# Patient Record
Sex: Male | Born: 1965 | Race: White | Hispanic: No | Smoking: Current every day smoker
Health system: Southern US, Community
[De-identification: ages and names within clinical notes are randomized; demographics above are authoritative.]

## PROBLEM LIST (undated history)

## (undated) DIAGNOSIS — R569 Unspecified convulsions: Secondary | ICD-10-CM

## (undated) DIAGNOSIS — I639 Cerebral infarction, unspecified: Secondary | ICD-10-CM

## (undated) DIAGNOSIS — G839 Paralytic syndrome, unspecified: Secondary | ICD-10-CM

## (undated) HISTORY — PX: BACK SURGERY: SHX140

---

## 1997-12-13 ENCOUNTER — Emergency Department (HOSPITAL_COMMUNITY): Admission: EM | Admit: 1997-12-13 | Discharge: 1997-12-13 | Payer: Self-pay | Admitting: Emergency Medicine

## 1997-12-14 ENCOUNTER — Emergency Department (HOSPITAL_COMMUNITY): Admission: EM | Admit: 1997-12-14 | Discharge: 1997-12-14 | Payer: Self-pay | Admitting: Emergency Medicine

## 1997-12-15 ENCOUNTER — Emergency Department (HOSPITAL_COMMUNITY): Admission: EM | Admit: 1997-12-15 | Discharge: 1997-12-15 | Payer: Self-pay | Admitting: *Deleted

## 1998-01-27 ENCOUNTER — Ambulatory Visit (HOSPITAL_COMMUNITY): Admission: RE | Admit: 1998-01-27 | Discharge: 1998-01-27 | Payer: Self-pay | Admitting: Orthopedic Surgery

## 1998-02-16 ENCOUNTER — Observation Stay (HOSPITAL_COMMUNITY): Admission: RE | Admit: 1998-02-16 | Discharge: 1998-02-17 | Payer: Self-pay | Admitting: Orthopedic Surgery

## 2004-04-29 ENCOUNTER — Emergency Department (HOSPITAL_COMMUNITY): Admission: EM | Admit: 2004-04-29 | Discharge: 2004-04-29 | Payer: Self-pay | Admitting: Emergency Medicine

## 2004-07-10 ENCOUNTER — Ambulatory Visit (HOSPITAL_COMMUNITY): Admission: RE | Admit: 2004-07-10 | Discharge: 2004-07-10 | Payer: Self-pay | Admitting: Neurosurgery

## 2008-09-14 ENCOUNTER — Emergency Department (HOSPITAL_COMMUNITY): Admission: EM | Admit: 2008-09-14 | Discharge: 2008-09-14 | Payer: Self-pay | Admitting: Family Medicine

## 2008-10-04 ENCOUNTER — Emergency Department (HOSPITAL_COMMUNITY): Admission: EM | Admit: 2008-10-04 | Discharge: 2008-10-04 | Payer: Self-pay | Admitting: Family Medicine

## 2008-10-13 ENCOUNTER — Emergency Department (HOSPITAL_COMMUNITY): Admission: EM | Admit: 2008-10-13 | Discharge: 2008-10-13 | Payer: Self-pay | Admitting: Emergency Medicine

## 2008-10-13 ENCOUNTER — Emergency Department (HOSPITAL_COMMUNITY): Admission: EM | Admit: 2008-10-13 | Discharge: 2008-10-13 | Payer: Self-pay | Admitting: Family Medicine

## 2009-01-25 ENCOUNTER — Encounter
Admission: RE | Admit: 2009-01-25 | Discharge: 2009-04-25 | Payer: Self-pay | Admitting: Physical Medicine & Rehabilitation

## 2009-02-01 ENCOUNTER — Ambulatory Visit: Payer: Self-pay | Admitting: Physical Medicine & Rehabilitation

## 2009-03-15 ENCOUNTER — Ambulatory Visit: Payer: Self-pay | Admitting: Physical Medicine & Rehabilitation

## 2009-05-10 ENCOUNTER — Encounter: Admission: RE | Admit: 2009-05-10 | Discharge: 2009-05-10 | Payer: Self-pay | Admitting: Neurosurgery

## 2009-08-28 ENCOUNTER — Emergency Department (HOSPITAL_COMMUNITY): Admission: EM | Admit: 2009-08-28 | Discharge: 2009-08-28 | Payer: Self-pay | Admitting: Emergency Medicine

## 2009-10-06 ENCOUNTER — Inpatient Hospital Stay (HOSPITAL_COMMUNITY): Admission: EM | Admit: 2009-10-06 | Discharge: 2009-10-14 | Payer: Self-pay | Admitting: Emergency Medicine

## 2009-10-06 ENCOUNTER — Ambulatory Visit: Payer: Self-pay | Admitting: Critical Care Medicine

## 2009-10-06 ENCOUNTER — Ambulatory Visit: Payer: Self-pay | Admitting: Cardiology

## 2009-10-12 ENCOUNTER — Encounter (INDEPENDENT_AMBULATORY_CARE_PROVIDER_SITE_OTHER): Payer: Self-pay | Admitting: Internal Medicine

## 2009-10-13 ENCOUNTER — Ambulatory Visit: Payer: Self-pay | Admitting: Psychiatry

## 2009-10-26 DEATH — deceased

## 2010-10-21 LAB — GLUCOSE, CAPILLARY
Glucose-Capillary: 100 mg/dL — ABNORMAL HIGH (ref 70–99)
Glucose-Capillary: 102 mg/dL — ABNORMAL HIGH (ref 70–99)
Glucose-Capillary: 103 mg/dL — ABNORMAL HIGH (ref 70–99)
Glucose-Capillary: 108 mg/dL — ABNORMAL HIGH (ref 70–99)
Glucose-Capillary: 109 mg/dL — ABNORMAL HIGH (ref 70–99)
Glucose-Capillary: 109 mg/dL — ABNORMAL HIGH (ref 70–99)
Glucose-Capillary: 109 mg/dL — ABNORMAL HIGH (ref 70–99)
Glucose-Capillary: 111 mg/dL — ABNORMAL HIGH (ref 70–99)
Glucose-Capillary: 112 mg/dL — ABNORMAL HIGH (ref 70–99)
Glucose-Capillary: 121 mg/dL — ABNORMAL HIGH (ref 70–99)
Glucose-Capillary: 122 mg/dL — ABNORMAL HIGH (ref 70–99)
Glucose-Capillary: 63 mg/dL — ABNORMAL LOW (ref 70–99)
Glucose-Capillary: 68 mg/dL — ABNORMAL LOW (ref 70–99)
Glucose-Capillary: 78 mg/dL (ref 70–99)
Glucose-Capillary: 84 mg/dL (ref 70–99)
Glucose-Capillary: 84 mg/dL (ref 70–99)

## 2010-10-21 LAB — BASIC METABOLIC PANEL
BUN: 10 mg/dL (ref 6–23)
BUN: 14 mg/dL (ref 6–23)
BUN: 15 mg/dL (ref 6–23)
BUN: 7 mg/dL (ref 6–23)
BUN: 9 mg/dL (ref 6–23)
CO2: 24 mEq/L (ref 19–32)
CO2: 26 mEq/L (ref 19–32)
CO2: 27 mEq/L (ref 19–32)
CO2: 30 mEq/L (ref 19–32)
Calcium: 7.4 mg/dL — ABNORMAL LOW (ref 8.4–10.5)
Calcium: 7.5 mg/dL — ABNORMAL LOW (ref 8.4–10.5)
Calcium: 7.9 mg/dL — ABNORMAL LOW (ref 8.4–10.5)
Calcium: 8 mg/dL — ABNORMAL LOW (ref 8.4–10.5)
Calcium: 8 mg/dL — ABNORMAL LOW (ref 8.4–10.5)
Calcium: 8.3 mg/dL — ABNORMAL LOW (ref 8.4–10.5)
Calcium: 8.4 mg/dL (ref 8.4–10.5)
Chloride: 105 mEq/L (ref 96–112)
Chloride: 108 mEq/L (ref 96–112)
Chloride: 108 mEq/L (ref 96–112)
Creatinine, Ser: 0.85 mg/dL (ref 0.4–1.5)
Creatinine, Ser: 0.86 mg/dL (ref 0.4–1.5)
Creatinine, Ser: 0.87 mg/dL (ref 0.4–1.5)
Creatinine, Ser: 0.88 mg/dL (ref 0.4–1.5)
Creatinine, Ser: 1 mg/dL (ref 0.4–1.5)
Creatinine, Ser: 1.24 mg/dL (ref 0.4–1.5)
GFR calc Af Amer: >60 mL/min (ref >60)
GFR calc Af Amer: >60 mL/min (ref >60)
GFR calc Af Amer: >60 mL/min (ref >60)
GFR calc Af Amer: >60 mL/min (ref >60)
GFR calc Af Amer: >60 mL/min (ref >60)
GFR calc non Af Amer: >60 mL/min (ref >60)
GFR calc non Af Amer: >60 mL/min (ref >60)
GFR calc non Af Amer: >60 mL/min (ref >60)
GFR calc non Af Amer: >60 mL/min (ref >60)
GFR calc non Af Amer: >60 mL/min (ref >60)
GFR calc non Af Amer: >60 mL/min (ref >60)
GFR calc non Af Amer: >60 mL/min (ref >60)
GFR calc non Af Amer: >60 mL/min (ref >60)
Glucose, Bld: 109 mg/dL — ABNORMAL HIGH (ref 70–99)
Glucose, Bld: 128 mg/dL — ABNORMAL HIGH (ref 70–99)
Glucose, Bld: 165 mg/dL — ABNORMAL HIGH (ref 70–99)
Glucose, Bld: 77 mg/dL (ref 70–99)
Glucose, Bld: 92 mg/dL (ref 70–99)
Glucose, Bld: 92 mg/dL (ref 70–99)
Glucose, Bld: 95 mg/dL (ref 70–99)
Potassium: 4 mEq/L (ref 3.5–5.1)
Potassium: 4 mEq/L (ref 3.5–5.1)
Sodium: 137 mEq/L (ref 135–145)
Sodium: 138 mEq/L (ref 135–145)
Sodium: 141 mEq/L (ref 135–145)
Sodium: 143 mEq/L (ref 135–145)
Sodium: 145 mEq/L (ref 135–145)
Sodium: 146 mEq/L — ABNORMAL HIGH (ref 135–145)

## 2010-10-21 LAB — ABO/RH: ABO/RH(D): O POS

## 2010-10-21 LAB — MAGNESIUM
Magnesium: 1.7 mg/dL (ref 1.5–2.5)
Magnesium: 1.7 mg/dL (ref 1.5–2.5)
Magnesium: 1.8 mg/dL (ref 1.5–2.5)
Magnesium: 1.9 mg/dL (ref 1.5–2.5)
Magnesium: 2.1 mg/dL (ref 1.5–2.5)

## 2010-10-21 LAB — CARDIAC PANEL(CRET KIN+CKTOT+MB+TROPI)
CK, MB: 4.1 ng/mL — ABNORMAL HIGH (ref 0.3–4.0)
CK, MB: 5.7 ng/mL — ABNORMAL HIGH (ref 0.3–4.0)
CK, MB: 6 ng/mL — ABNORMAL HIGH (ref 0.3–4.0)
Relative Index: 0.9 (ref 0.0–2.5)
Relative Index: 1.7 (ref 0.0–2.5)
Total CK: 217 U/L (ref 7–232)
Total CK: 237 U/L — ABNORMAL HIGH (ref 7–232)
Troponin I: 0.05 ng/mL (ref 0.00–0.06)

## 2010-10-21 LAB — DIFFERENTIAL
Basophils Absolute: 0 10*3/uL (ref 0.0–0.1)
Basophils Absolute: 0 10*3/uL (ref 0.0–0.1)
Basophils Absolute: 0 10*3/uL (ref 0.0–0.1)
Basophils Absolute: 0 10*3/uL (ref 0.0–0.1)
Basophils Relative: 0 % (ref 0–1)
Basophils Relative: 0 % (ref 0–1)
Basophils Relative: 0 % (ref 0–1)
Basophils Relative: 0 % (ref 0–1)
Eosinophils Absolute: 0 10*3/uL (ref 0.0–0.7)
Eosinophils Absolute: 0.2 10*3/uL (ref 0.0–0.7)
Eosinophils Relative: 0 % (ref 0–5)
Eosinophils Relative: 2 % (ref 0–5)
Eosinophils Relative: 4 % (ref 0–5)
Lymphocytes Relative: 23 % (ref 12–46)
Lymphocytes Relative: 24 % (ref 12–46)
Lymphocytes Relative: 4 % — ABNORMAL LOW (ref 12–46)
Lymphocytes Relative: 8 % — ABNORMAL LOW (ref 12–46)
Lymphs Abs: 1.3 10*3/uL (ref 0.7–4.0)
Lymphs Abs: 1.3 10*3/uL (ref 0.7–4.0)
Monocytes Absolute: 0.1 10*3/uL (ref 0.1–1.0)
Monocytes Absolute: 0.4 10*3/uL (ref 0.1–1.0)
Monocytes Absolute: 0.6 10*3/uL (ref 0.1–1.0)
Monocytes Absolute: 0.6 10*3/uL (ref 0.1–1.0)
Monocytes Relative: 0 % — ABNORMAL LOW (ref 3–12)
Monocytes Relative: 7 % (ref 3–12)
Neutro Abs: 10.6 10*3/uL — ABNORMAL HIGH (ref 1.7–7.7)
Neutro Abs: 31 10*3/uL — ABNORMAL HIGH (ref 1.7–7.7)
Neutro Abs: 5 10*3/uL (ref 1.7–7.7)
Neutro Abs: 5.4 10*3/uL (ref 1.7–7.7)
Neutrophils Relative %: 66 % (ref 43–77)
Neutrophils Relative %: 96 % — ABNORMAL HIGH (ref 43–77)

## 2010-10-21 LAB — BLOOD GAS, ARTERIAL
Acid-Base Excess: 0.2 mmol/L (ref 0.0–2.0)
Acid-base deficit: 3 mmol/L — ABNORMAL HIGH (ref 0.0–2.0)
Bicarbonate: 23.9 mEq/L (ref 20.0–24.0)
FIO2: 0.4 %
FIO2: 0.8 %
MECHVT: 600 mL
O2 Saturation: 98.2 %
PEEP: 5 cmH2O
Patient temperature: 98.6
Patient temperature: 98.6
RATE: 22 resp/min
RATE: 22 resp/min
TCO2: 22.9 mmol/L (ref 0–100)
pCO2 arterial: 40.4 mmHg (ref 35.0–45.0)
pO2, Arterial: 82.6 mmHg (ref 80.0–100.0)

## 2010-10-21 LAB — POCT I-STAT 3, ART BLOOD GAS (G3+)
Acid-base deficit: 6 mmol/L — ABNORMAL HIGH (ref 0.0–2.0)
Bicarbonate: 24.5 mEq/L — ABNORMAL HIGH (ref 20.0–24.0)
O2 Saturation: 100 %
TCO2: 27 mmol/L (ref 0–100)
pCO2 arterial: 70.2 mmHg (ref 35.0–45.0)
pH, Arterial: 7.15 — CL (ref 7.350–7.450)
pO2, Arterial: 232 mmHg — ABNORMAL HIGH (ref 80.0–100.0)

## 2010-10-21 LAB — URINALYSIS, ROUTINE W REFLEX MICROSCOPIC
Glucose, UA: 250 mg/dL — AB
Leukocytes, UA: NEGATIVE
Nitrite: NEGATIVE
Specific Gravity, Urine: 1.016 (ref 1.005–1.030)
pH: 6 (ref 5.0–8.0)

## 2010-10-21 LAB — COMPREHENSIVE METABOLIC PANEL
ALT: 33 U/L (ref 0–53)
AST: 46 U/L — ABNORMAL HIGH (ref 0–37)
Albumin: 3.9 g/dL (ref 3.5–5.2)
Alkaline Phosphatase: 132 U/L — ABNORMAL HIGH (ref 39–117)
BUN: 7 mg/dL (ref 6–23)
CO2: 22 mEq/L (ref 19–32)
Calcium: 7.9 mg/dL — ABNORMAL LOW (ref 8.4–10.5)
Chloride: 104 mEq/L (ref 96–112)
Creatinine, Ser: 1.66 mg/dL — ABNORMAL HIGH (ref 0.4–1.5)
GFR calc Af Amer: 54 mL/min — ABNORMAL LOW (ref >60)
GFR calc non Af Amer: 45 mL/min — ABNORMAL LOW (ref >60)
Glucose, Bld: 190 mg/dL — ABNORMAL HIGH (ref 70–99)
Potassium: 4.5 mEq/L (ref 3.5–5.1)
Sodium: 137 mEq/L (ref 135–145)
Total Bilirubin: 0.5 mg/dL (ref 0.3–1.2)
Total Protein: 7.4 g/dL (ref 6.0–8.3)

## 2010-10-21 LAB — CULTURE, RESPIRATORY W GRAM STAIN

## 2010-10-21 LAB — CBC
HCT: 37.7 % — ABNORMAL LOW (ref 39.0–52.0)
HCT: 47.5 % (ref 39.0–52.0)
Hemoglobin: 11.6 g/dL — ABNORMAL LOW (ref 13.0–17.0)
Hemoglobin: 12.9 g/dL — ABNORMAL LOW (ref 13.0–17.0)
Hemoglobin: 12.9 g/dL — ABNORMAL LOW (ref 13.0–17.0)
Hemoglobin: 14.1 g/dL (ref 13.0–17.0)
Hemoglobin: 16.1 g/dL (ref 13.0–17.0)
MCHC: 33.8 g/dL (ref 30.0–36.0)
MCHC: 33.8 g/dL (ref 30.0–36.0)
MCHC: 34.2 g/dL (ref 30.0–36.0)
MCHC: 34.5 g/dL (ref 30.0–36.0)
MCHC: 34.5 g/dL (ref 30.0–36.0)
MCV: 93.9 fL (ref 78.0–100.0)
MCV: 94.1 fL (ref 78.0–100.0)
MCV: 96.5 fL (ref 78.0–100.0)
Platelets: 143 10*3/uL — ABNORMAL LOW (ref 150–400)
Platelets: 197 10*3/uL (ref 150–400)
Platelets: 291 10*3/uL (ref 150–400)
RBC: 3.61 MIL/uL — ABNORMAL LOW (ref 4.22–5.81)
RBC: 4 MIL/uL — ABNORMAL LOW (ref 4.22–5.81)
RBC: 4.31 MIL/uL (ref 4.22–5.81)
RBC: 4.92 MIL/uL (ref 4.22–5.81)
RDW: 12.4 % (ref 11.5–15.5)
RDW: 12.4 % (ref 11.5–15.5)
RDW: 12.5 % (ref 11.5–15.5)
RDW: 12.6 % (ref 11.5–15.5)
WBC: 10.1 10*3/uL (ref 4.0–10.5)
WBC: 11.9 10*3/uL — ABNORMAL HIGH (ref 4.0–10.5)
WBC: 32.5 10*3/uL — ABNORMAL HIGH (ref 4.0–10.5)
WBC: 8.5 10*3/uL (ref 4.0–10.5)

## 2010-10-21 LAB — LIPID PANEL
Cholesterol: 106 mg/dL (ref 0–200)
LDL Cholesterol: 69 mg/dL (ref 0–99)

## 2010-10-21 LAB — TYPE AND SCREEN
ABO/RH(D): O POS
Antibody Screen: NEGATIVE

## 2010-10-21 LAB — PHOSPHORUS: Phosphorus: 2.7 mg/dL (ref 2.3–4.6)

## 2010-10-21 LAB — URINE MICROSCOPIC-ADD ON

## 2010-10-21 LAB — MRSA PCR SCREENING: MRSA by PCR: NEGATIVE

## 2010-10-21 LAB — AMMONIA: Ammonia: 69 umol/L — ABNORMAL HIGH (ref 11–35)

## 2010-10-21 LAB — APTT: aPTT: 25 seconds (ref 24–37)

## 2010-10-21 LAB — RAPID URINE DRUG SCREEN, HOSP PERFORMED: Cocaine: POSITIVE — AB

## 2010-10-21 LAB — BRAIN NATRIURETIC PEPTIDE: Pro B Natriuretic peptide (BNP): 145 pg/mL — ABNORMAL HIGH (ref 0.0–100.0)

## 2010-10-21 LAB — ETHANOL: Alcohol, Ethyl (B): <5 mg/dL (ref 0–10)

## 2010-10-21 LAB — LACTIC ACID, PLASMA: Lactic Acid, Venous: 8.4 mmol/L — ABNORMAL HIGH (ref 0.5–2.2)

## 2010-10-21 LAB — CULTURE, BLOOD (ROUTINE X 2)

## 2010-10-21 LAB — PROTIME-INR
INR: 1.21 (ref 0.00–1.49)
Prothrombin Time: 15.2 seconds (ref 11.6–15.2)
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
## 2013-09-25 ENCOUNTER — Encounter (HOSPITAL_COMMUNITY): Payer: Self-pay | Admitting: Emergency Medicine

## 2013-09-25 ENCOUNTER — Emergency Department (HOSPITAL_COMMUNITY)
Admission: EM | Admit: 2013-09-25 | Discharge: 2013-09-25 | Disposition: A | Discharge status: Home / Self Care | Payer: Medicaid Other | Attending: Emergency Medicine | Admitting: Emergency Medicine

## 2013-09-25 ENCOUNTER — Emergency Department (HOSPITAL_COMMUNITY): Payer: Medicaid Other

## 2013-09-25 DIAGNOSIS — J189 Pneumonia, unspecified organism: Secondary | ICD-10-CM | POA: Insufficient documentation

## 2013-09-25 DIAGNOSIS — Y929 Unspecified place or not applicable: Secondary | ICD-10-CM | POA: Insufficient documentation

## 2013-09-25 DIAGNOSIS — S01511A Laceration without foreign body of lip, initial encounter: Secondary | ICD-10-CM

## 2013-09-25 DIAGNOSIS — R569 Unspecified convulsions: Secondary | ICD-10-CM

## 2013-09-25 DIAGNOSIS — Z79899 Other long term (current) drug therapy: Secondary | ICD-10-CM | POA: Insufficient documentation

## 2013-09-25 DIAGNOSIS — S01501A Unspecified open wound of lip, initial encounter: Secondary | ICD-10-CM | POA: Insufficient documentation

## 2013-09-25 DIAGNOSIS — Y9389 Activity, other specified: Secondary | ICD-10-CM | POA: Insufficient documentation

## 2013-09-25 DIAGNOSIS — S00209A Unspecified superficial injury of unspecified eyelid and periocular area, initial encounter: Secondary | ICD-10-CM | POA: Insufficient documentation

## 2013-09-25 DIAGNOSIS — I69959 Hemiplegia and hemiparesis following unspecified cerebrovascular disease affecting unspecified side: Secondary | ICD-10-CM | POA: Insufficient documentation

## 2013-09-25 DIAGNOSIS — X58XXXA Exposure to other specified factors, initial encounter: Secondary | ICD-10-CM | POA: Insufficient documentation

## 2013-09-25 DIAGNOSIS — G40909 Epilepsy, unspecified, not intractable, without status epilepticus: Secondary | ICD-10-CM | POA: Insufficient documentation

## 2013-09-25 HISTORY — DX: Unspecified convulsions: R56.9

## 2013-09-25 HISTORY — DX: Cerebral infarction, unspecified: I63.9

## 2013-09-25 LAB — I-STAT CHEM 8, ED
BUN: 6 mg/dL (ref 6–23)
CALCIUM ION: 1.18 mmol/L (ref 1.12–1.23)
Chloride: 103 mEq/L (ref 96–112)
Creatinine, Ser: 1.2 mg/dL (ref 0.50–1.35)
GLUCOSE: 104 mg/dL — AB (ref 70–99)
HEMATOCRIT: 52 % (ref 39.0–52.0)
Hemoglobin: 17.7 g/dL — ABNORMAL HIGH (ref 13.0–17.0)
Potassium: 4.2 mEq/L (ref 3.7–5.3)
Sodium: 144 mEq/L (ref 137–147)
TCO2: 28 mmol/L (ref 0–100)

## 2013-09-25 LAB — CBC WITH DIFFERENTIAL/PLATELET
BASOS ABS: 0 10*3/uL (ref 0.0–0.1)
BASOS PCT: 0 % (ref 0–1)
EOS PCT: 3 % (ref 0–5)
Eosinophils Absolute: 0.2 10*3/uL (ref 0.0–0.7)
HCT: 48.2 % (ref 39.0–52.0)
Hemoglobin: 17.1 g/dL — ABNORMAL HIGH (ref 13.0–17.0)
LYMPHS PCT: 13 % (ref 12–46)
Lymphs Abs: 0.9 10*3/uL (ref 0.7–4.0)
MCH: 32.3 pg (ref 26.0–34.0)
MCHC: 35.5 g/dL (ref 30.0–36.0)
MCV: 91.1 fL (ref 78.0–100.0)
Monocytes Absolute: 0.6 10*3/uL (ref 0.1–1.0)
Monocytes Relative: 8 % (ref 3–12)
Neutro Abs: 5.1 10*3/uL (ref 1.7–7.7)
Neutrophils Relative %: 75 % (ref 43–77)
PLATELETS: 129 10*3/uL — AB (ref 150–400)
RBC: 5.29 MIL/uL (ref 4.22–5.81)
RDW: 12.3 % (ref 11.5–15.5)
WBC: 6.8 10*3/uL (ref 4.0–10.5)

## 2013-09-25 LAB — URINE MICROSCOPIC-ADD ON

## 2013-09-25 LAB — URINALYSIS, ROUTINE W REFLEX MICROSCOPIC
Bilirubin Urine: NEGATIVE
Glucose, UA: NEGATIVE mg/dL
Hgb urine dipstick: NEGATIVE
KETONES UR: NEGATIVE mg/dL
LEUKOCYTES UA: NEGATIVE
NITRITE: NEGATIVE
PH: 6.5 (ref 5.0–8.0)
PROTEIN: 30 mg/dL — AB
Specific Gravity, Urine: 1.021 (ref 1.005–1.030)
UROBILINOGEN UA: 0.2 mg/dL (ref 0.0–1.0)

## 2013-09-25 MED ORDER — AZITHROMYCIN 250 MG PO TABS: 250.0000 mg | ORAL_TABLET | Freq: Every day | ORAL | Status: DC

## 2013-09-25 MED ORDER — LEVETIRACETAM 500 MG PO TABS: 500.0000 mg | ORAL_TABLET | Freq: Two times a day (BID) | ORAL | Status: DC

## 2013-09-25 MED ORDER — SODIUM CHLORIDE 0.9 % IV SOLN
500.0000 mg | Freq: Once | INTRAVENOUS | Status: AC
  Administered 2013-09-25: 500 mg via INTRAVENOUS
  Filled 2013-09-25: qty 5

## 2013-09-25 MED ORDER — ESCITALOPRAM OXALATE 20 MG PO TABS: 20.0000 mg | ORAL_TABLET | Freq: Every day | ORAL | Status: DC

## 2013-09-25 MED ORDER — BACLOFEN 20 MG PO TABS: 20.0000 mg | ORAL_TABLET | Freq: Four times a day (QID) | ORAL | Status: DC

## 2013-09-25 MED ORDER — AZITHROMYCIN 250 MG PO TABS
500.0000 mg | ORAL_TABLET | Freq: Once | ORAL | Status: AC
  Administered 2013-09-25: 500 mg via ORAL
  Filled 2013-09-25: qty 2

## 2013-09-25 NOTE — Progress Notes (Signed)
Case Manager consult for discharge planning.Met patient and girlfriend Darlene at bedside.CM role explained.Darlene reports patient had a seizure at home, and reports  Patient has refused to take his daily medications for the past two days.Darlene reports they have moved and are yet to establish a new PCP.Education provided to patient  Re the Importance of establishing PCP services ,medication compliance and following ED discharge Instructions.Patient provided with a MEDICAID resource list and a list Of PCP contacts.No further CM needs Identified.

## 2013-09-25 NOTE — ED Provider Notes (Signed)
CSN: [id number]     Arrival date & time 09/25/13  7096 History   First MD Initiated Contact with Patient 09/25/13 [phone]     Chief Complaint  Patient presents with  . Seizures     (Consider location/radiation/quality/duration/timing/severity/associated sxs/prior Treatment) HPI Comments: History provided by girlfriend that takes care of pt. Pt has history of stroke 2 years ago which left right arm paralysis, pt started to have seizures after the stroke but has not had one in a couple of years. Girlfriend states that pt has not been taking his medications the last 2 days. Has been acting normal. She states that she left him in the bedroom and she heard a noise and she went in there and he was having full body shaking. She is unsure of how long the episodes lasted. ems reports pt being combative so pt was given versed. Girlfriend states that pt is at his baseline at this time. She also noted blood from the mouth   Past Medical History  Diagnosis Date  . Stroke   . Seizures    History reviewed. No pertinent past surgical history. No family history on file. History  Substance Use Topics  . Smoking status: Not on file  . Smokeless tobacco: Not on file  . Alcohol Use: Not on file    Review of Systems  Constitutional: Negative.   Respiratory: Negative.   Cardiovascular: Negative.       Allergies  Review of patient's allergies indicates no known allergies.  Home Medications   Current Outpatient Rx  Name  Route  Sig  Dispense  Refill  . baclofen (LIORESAL) 20 MG tablet   Oral   Take 20 mg by mouth 4 (four) times daily.         Marland Kitchen escitalopram (LEXAPRO) 20 MG tablet   Oral   Take 20 mg by mouth daily.         Marland Kitchen levETIRAcetam (KEPPRA) 500 MG tablet   Oral   Take 500 mg by mouth 2 (two) times daily.         . Multiple Vitamin (MULTIVITAMIN) capsule   Oral   Take 1 capsule by mouth daily.          BP 115/77  Pulse 90  Temp(Src) 97.6 F (36.4 C) (Oral)  Resp 22   SpO2 95% Physical Exam  Vitals reviewed. Constitutional: He appears well-developed and well-nourished.  HENT:  Laceration noted to the upper OQH:[id number] to the right eyelid  Eyes: Conjunctivae are normal.  Cardiovascular: Normal rate and regular rhythm.   Pulmonary/Chest: Effort normal.  Productive cough noted on exam  Musculoskeletal:  Moving all extremities except right upper  Neurological: He is alert.  Pt repetitively saying good ole boy    ED Course  LACERATION REPAIR Date/Time: 09/25/2013 10:18 AM Performed by: Teressa Lower Authorized by: Teressa Lower Consent: Verbal consent obtained. written consent not obtained. Patient identity confirmed: arm band Body area: mouth Location details: upper lip, interior Laceration length: 1 cm Local anesthetic: lidocaine 1% with epinephrine Irrigation solution: saline Wound skin closure material used: 5.o vicryl. Number of sutures: 3 Technique: simple Approximation: close Approximation difficulty: simple Patient tolerance: Patient tolerated the procedure well with no immediate complications.   (including critical care time) Labs Review Labs Reviewed  CBC WITH DIFFERENTIAL - Abnormal; Notable for the following:    Hemoglobin 17.1 (*)    Platelets 129 (*)    All other components within normal limits  URINALYSIS, ROUTINE W REFLEX MICROSCOPIC -  Abnormal; Notable for the following:    APPearance CLOUDY (*)    Protein, ur 30 (*)    All other components within normal limits  I-STAT CHEM 8, ED - Abnormal; Notable for the following:    Glucose, Bld 104 (*)    Hemoglobin 17.7 (*)    All other components within normal limits  URINE MICROSCOPIC-ADD ON   Imaging Review Dg Chest 2 View  09/25/2013   CLINICAL DATA:  Seizure and cough.  EXAM: CHEST  2 VIEW  COMPARISON:  DG CHEST 1V PORT dated 10/10/2009  FINDINGS: Midline trachea. Normal heart size and mediastinal contours. No pleural effusion or pneumothorax. Lower lobe  predominant interstitial thickening is mild. No lobar consolidation. Subtle left base airspace disease.  IMPRESSION: Interstitial thickening, suspicious for chronic bronchitis or smoking.  Subtle left lower lobe airspace opacity. Favor scarring or atelectasis. Difficult to exclude early infection or aspiration, given the clinical history of seizure and cough. Consider short term radiographic followup.   Electronically Signed   By: Jeronimo GreavesKyle  Talbot M.D.   On: 09/25/2013 10:56   Ct Head Wo Contrast  09/25/2013   CLINICAL DATA:  Seizure. History of seizure and stroke. Right-sided deficits noted from prior stroke. Right eye swelling. Fall.  EXAM: CT HEAD WITHOUT CONTRAST  CT CERVICAL SPINE WITHOUT CONTRAST  TECHNIQUE: Multidetector CT imaging of the head and cervical spine was performed following the standard protocol without intravenous contrast. Multiplanar CT image reconstructions of the cervical spine were also generated.  COMPARISON:  CT HEAD W/O CM dated 10/06/2009  FINDINGS: CT HEAD FINDINGS  Sinuses/Soft tissues: No significant soft tissue swelling. Mucosal thickening of ethmoid air cells, sphenoid sinuses, and left maxillary sinus. Remote right nasal bone fracture. Clear mastoid air cells.  Intracranial: Age advanced cerebral atrophy. Encephalomalacia involving the left middle cerebral artery distribution with resultant ex vacuo dilatation of the left lateral ventricle. No mass lesion, hemorrhage, hydrocephalus, acute infarct, intra-axial, or extra-axial fluid collection. Cerebellar atrophy as well.  CT CERVICAL SPINE FINDINGS  Spinal visualization through the bottom of T1. Prevertebral soft tissues are within normal limits. Spondylosis. This results in left-sided neural foraminal narrowing at C4-5 and C3-4. Mild central canal stenosis at C3-4.  Skull base intact. Maintenance of vertebral body height. Straightening of expected cervical lordosis. Facets are well-aligned. Coronal reformats demonstrate a normal C1-C2  articulation.  IMPRESSION: 1. No acute or posttraumatic intracranial abnormality. 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 2. Advanced for age cervical spondylosis without fracture or subluxation. Straightening of expected cervical lordosis could be positional, due to muscular spasm, or ligamentous injury. 3. Sinus disease.   Electronically Signed   By: Jeronimo GreavesKyle  Talbot M.D.   On: 09/25/2013 11:11   Ct Cervical Spine Wo Contrast  09/25/2013   CLINICAL DATA:  Seizure. History of seizure and stroke. Right-sided deficits noted from prior stroke. Right eye swelling. Fall.  EXAM: CT HEAD WITHOUT CONTRAST  CT CERVICAL SPINE WITHOUT CONTRAST  TECHNIQUE: Multidetector CT imaging of the head and cervical spine was performed following the standard protocol without intravenous contrast. Multiplanar CT image reconstructions of the cervical spine were also generated.  COMPARISON:  CT HEAD W/O CM dated 10/06/2009  FINDINGS: CT HEAD FINDINGS  Sinuses/Soft tissues: No significant soft tissue swelling. Mucosal thickening of ethmoid air cells, sphenoid sinuses, and left maxillary sinus. Remote right nasal bone fracture. Clear mastoid air cells.  Intracranial: Age advanced cerebral atrophy. Encephalomalacia involving the left middle cerebral artery distribution  with resultant ex vacuo dilatation of the left lateral ventricle. No mass lesion, hemorrhage, hydrocephalus, acute infarct, intra-axial, or extra-axial fluid collection. Cerebellar atrophy as well.  CT CERVICAL SPINE FINDINGS  Spinal visualization through the bottom of T1. Prevertebral soft tissues are within normal limits. Spondylosis. This results in left-sided neural foraminal narrowing at C4-5 and C3-4. Mild central canal stenosis at C3-4.  Skull base intact. Maintenance of vertebral body height. Straightening of expected cervical lordosis. Facets are well-aligned. Coronal reformats demonstrate a normal C1-C2  articulation.  IMPRESSION: 1. No acute or posttraumatic intracranial abnormality. 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 2. Advanced for age cervical spondylosis without fracture or subluxation. Straightening of expected cervical lordosis could be positional, due to muscular spasm, or ligamentous injury. 3. Sinus disease.   Electronically Signed   By: Jeronimo Greaves M.D.   On: 09/25/2013 11:11     EKG Interpretation None      MDM   Final diagnoses:  Seizure  Lip laceration  Pneumonia    Pt is back at baseline and is ambulating at baseline. Pt home medications refilled as pt is trying to get a new provider    Teressa Lower, NP 09/25/13 1301

## 2013-09-25 NOTE — ED Provider Notes (Signed)
Medical screening examination/treatment/procedure(s) were performed by non-physician practitioner and as supervising physician I was immediately available for consultation/collaboration.  Dalonte Maddisen Vought, MD 09/25/13 1526   Medical screening examination/treatment/procedure(s) were performed by non-physician practitioner and as supervising physician I was immediately available for consultation/collaboration.      Gerhard Munch, MD 09/25/13 [phone]

## 2013-09-25 NOTE — Discharge Instructions (Signed)
The keppra can be crushed. Place the crushed up tablet in a spoon full of something like apple sauce or pudding. Facial Laceration  A facial laceration is a cut on the face. These injuries can be painful and cause bleeding. Lacerations usually heal quickly, but they need special care to reduce scarring. DIAGNOSIS  Your health care provider will take a medical history, ask for details about how the injury occurred, and examine the wound to determine how deep the cut is. TREATMENT  Some facial lacerations may not require closure. Others may not be able to be closed because of an increased risk of infection. The risk of infection and the chance for successful closure will depend on various factors, including the amount of time since the injury occurred. The wound may be cleaned to help prevent infection. If closure is appropriate, pain medicines may be given if needed. Your health care provider will use stitches (sutures), wound glue (adhesive), or skin adhesive strips to repair the laceration. These tools bring the skin edges together to allow for faster healing and a better cosmetic outcome. If needed, you may also be given a tetanus shot. HOME CARE INSTRUCTIONS  Only take over-the-counter or prescription medicines as directed by your health care provider.  Follow your health care provider's instructions for wound care. These instructions will vary depending on the technique used for closing the wound. For Sutures:  Keep the wound clean and dry.   If you were given a bandage (dressing), you should change it at least once a day. Also change the dressing if it becomes wet or dirty, or as directed by your health care provider.   Wash the wound with soap and water 2 times a day. Rinse the wound off with water to remove all soap. Pat the wound dry with a clean towel.   After cleaning, apply a thin layer of the antibiotic ointment recommended by your health care provider. This will help prevent  infection and keep the dressing from sticking.   You may shower as usual after the first 24 hours. Do not soak the wound in water until the sutures are removed.   Get your sutures removed as directed by your health care provider. With facial lacerations, sutures should usually be taken out after 4 5 days to avoid stitch marks.   Wait a few days after your sutures are removed before applying any makeup. For Skin Adhesive Strips:  Keep the wound clean and dry.   Do not get the skin adhesive strips wet. You may bathe carefully, using caution to keep the wound dry.   If the wound gets wet, pat it dry with a clean towel.   Skin adhesive strips will fall off on their own. You may trim the strips as the wound heals. Do not remove skin adhesive strips that are still stuck to the wound. They will fall off in time.  For Wound Adhesive:  You may briefly wet your wound in the shower or bath. Do not soak or scrub the wound. Do not swim. Avoid periods of heavy sweating until the skin adhesive has fallen off on its own. After showering or bathing, gently pat the wound dry with a clean towel.   Do not apply liquid medicine, cream medicine, ointment medicine, or makeup to your wound while the skin adhesive is in place. This may loosen the film before your wound is healed.   If a dressing is placed over the wound, be careful not to apply tape  directly over the skin adhesive. This may cause the adhesive to be pulled off before the wound is healed.   Avoid prolonged exposure to sunlight or tanning lamps while the skin adhesive is in place.  The skin adhesive will usually remain in place for 5 10 days, then naturally fall off the skin. Do not pick at the adhesive film.  After Healing: Once the wound has healed, cover the wound with sunscreen during the day for 1 full year. This can help minimize scarring. Exposure to ultraviolet light in the first year will darken the scar. It can take 1 2 years  for the scar to lose its redness and to heal completely.  SEEK IMMEDIATE MEDICAL CARE IF:  You have redness, pain, or swelling around the wound.   You see ayellowish-white fluid (pus) coming from the wound.   You have chills or a fever.  MAKE SURE YOU:  Understand these instructions.  Will watch your condition.  Will get help right away if you are not doing well or get worse. Document Released: 08/21/2004 Document Revised: 05/04/2013 Document Reviewed: 02/24/2013 [facility] Patient Information 2014 [place], [place].  Seizure, Adult A seizure is abnormal electrical activity in the brain. Seizures usually last from 30 seconds to 2 minutes. There are various types of seizures. Before a seizure, you may have a warning sensation (aura) that a seizure is about to occur. An aura may include the following symptoms:   Fear or anxiety.  Nausea.  Feeling like the room is spinning (vertigo).  Vision changes, such as seeing flashing lights or spots. Common symptoms during a seizure include:  A change in attention or behavior (altered mental status).  Convulsions with rhythmic jerking movements.  Drooling.  Rapid eye movements.  Grunting.  Loss of bladder and bowel control.  Bitter taste in the mouth.  Tongue biting. After a seizure, you may feel confused and sleepy. You may also have an injury resulting from convulsions during the seizure. HOME CARE INSTRUCTIONS   If you are given medicines, take them exactly as prescribed by your health care provider.  Keep all follow-up appointments as directed by your health care provider.  Do not swim or drive or engage in risky activity during which a seizure could cause further injury to you or others until your health care provider says it is OK.  Get adequate rest.  Teach friends and family what to do if you have a seizure. They should:  Lay you on the ground to prevent a fall.  Put a cushion under your head.  Loosen any  tight clothing around your neck.  Turn you on your side. If vomiting occurs, this helps keep your airway clear.  Stay with you until you recover.  Know whether or not you need emergency care. SEEK IMMEDIATE MEDICAL CARE IF:  The seizure lasts longer than 5 minutes.  The seizure is severe or you do not wake up immediately after the seizure.  You have an altered mental status after the seizure.  You are having more frequent or worsening seizures. Someone should drive you to the emergency department or call local emergency services (911 in U.[place] MAKE SURE YOU:  Understand these instructions.  Will watch your condition.  Will get help right away if you are not doing well or get worse. Document Released: 07/11/2000 Document Revised: 05/04/2013 Document Reviewed: 02/23/2013 [facility] Patient Information 2014 [place], [place].   Emergency Department Resource Guide 1) Find a Doctor and Pay Out of Pocket Although you  won't have to find out who is covered by your insurance plan, it is a good idea to ask around and get recommendations. You will then need to call the office and see if the doctor you have chosen will accept you as a new patient and what types of options they offer for patients who are self-pay. Some doctors offer discounts or will set up payment plans for their patients who do not have insurance, but you will need to ask so you aren't surprised when you get to your appointment.  2) Contact Your Local Health Department Not all health departments have doctors that can see patients for sick visits, but many do, so it is worth a call to see if yours does. If you don't know where your local health department is, you can check in your phone book. The CDC also has a tool to help you locate your state's health department, and many state websites also have listings of all of their local health departments.  3) Find a Walk-in Clinic If your illness is not likely to be very severe or  complicated, you may want to try a walk in clinic. These are popping up all over the country in pharmacies, drugstores, and shopping centers. They're usually staffed by nurse practitioners or physician assistants that have been trained to treat common illnesses and complaints. They're usually fairly quick and inexpensive. However, if you have serious medical issues or chronic medical problems, these are probably not your best option.  No Primary Care Doctor: - Call Health Connect at  [phone] - they can help you locate a primary care doctor that  accepts your insurance, provides certain services, etc. - Physician Referral Service- [phone]  Chronic Pain Problems: Organization         Address  Phone   Notes  [facility]  [phone] Patients need to be referred by their primary care doctor.   Medication Assistance: Organization         Address  Phone   Notes  [facility] [address] [place], [place] [address] [phone] --Must be a resident of [facility] -- Must have NO insurance coverage whatsoever (no Medicaid/ Medicare, etc.) -- The pt. MUST have a primary care doctor that directs their care regularly and follows them in the community   [facility]ist  [phone]   [employer]  [phone]    Agencies that provide inexpensive medical care: Organization         Address  Phone   Notes  [facility]  [phone]   [facility]    [phone]   [facility] [address] [place], [place] [address] [phone]   [facility] [address], [place] [phone]   [facility]anned Parenthood    [phone]) [phone]   [facility]    [phone]   [facility] and [facility]  [address], [place] Phone:  [phone], Fax:  [phone] Hours of Operation:  9 am - 6 pm, M-F.  Also accepts  Medicaid/Medicare and self-pay.  [facility]  [address], [place] Phone: [phone], Fax: [phone]. Hours of Operation:  8:30 am - 5:30 pm, M-F.  Also accepts Medicaid and self-pay.  [facility] [place] [address], [place] Phone: [phone]   [facility] [address] [place], [place] [phone], Ext. [phone] Mondays & Thursdays: 7-9 AM.  First 15 patients are seen on a first come, first serve basis.    Medicaid-accepting Health Central Providers:  Organization         Address  Phone   Notes  [facility] [address], [place] [phone] Also accepts self-pay patients.  [facility] [address] Laurell Josephs [place], [place]  [phone]   [facility] [address], [place] [phone]   [facility] [address] [place] [phone]   Renaye Rakers [address], [place]   [phone] Only accepts [place] Access [place] patients after they have their name applied to their card.   Self-Pay (no insurance) in [facility]:  Organization         Address  Phone   Notes  Sickle Cell Patients, [facility] Internal Medicine [address] [place], [place] [phone]   [facility] Urgent Care [address] [place], [place] [phone]   [facility] [place]  [address],  [phone]   [facility]/Dr. Osei-Bonsu  [address] [place] or [address], [place] [phone] Phone number for both [place] and [place] locations is the same.  [facility]gent Medical and [facility] [address] [place] [phone]   [facility] [address] [place] or [address] [phone]   [facility] [address] [place] [phone], phone; [phone], fax Sees patients 1st and 3rd Saturday of every month.  Must not qualify for public or private insurance (i.e. Medicaid, Medicare, [place] Health Choice, Veterans' Benefits)  Household income should be no more than 200% of the poverty level The clinic cannot treat you if you are pregnant or think you are pregnant  Sexually transmitted diseases are not treated at the clinic.    Dental Care: Organization         Address  Phone  Notes  [facility] [address] [place], [place] [phone] Accepts children up to age 68 who are enrolled in [place] or [place] Health Choice; pregnant women with a Medicaid card; and children who have applied for Medicaid or [place] Health Choice, but were declined, whose parents can pay a reduced fee at time of service.  [facility] Department of [facility]  [address], [place] [phone] Accepts children up to age 39 who are enrolled in [place] or  Health Choice; pregnant women with a Medicaid card; and children who have applied for Medicaid or  Health Choice, but were declined, whose parents can pay a reduced fee at time of service.  [facility]  [address] [place], [place] [phone] Patients are seen by appointment only. Walk-ins are not accepted. [facility] will see patients 31 years of age and older. Monday - Tuesday (8am-5pm) Most Wednesdays (8:30-5pm) $30 per visit, cash only  [facility]  [address], [facility] [phone] Patients are seen by appointment only. Walk-ins are not accepted. [facility] will see patients 48 years of age and older. One Wednesday Evening (Monthly: Volunteer Based).  $30 per visit, cash only  [employer] of  Dentistry Clinics  [phone] for adults; Children under age 83, call Graduate Pediatric Dentistry at [phone]. Children aged  13-14, please call [phone] to request a pediatric application.  Dental services are provided in all areas of dental care including fillings, crowns and bridges, complete and partial dentures, implants, gum treatment, root canals, and extractions. Preventive care is also provided. Treatment is provided to both adults and children. Patients are selected via a lottery and there is often a waiting list.   [facility] [address], [place]  [phone] [email or web address]   [facility] [address] [place], [place] [phone], Ext. [phone] Second and Fourth Thursday of each month, opens at 6:30 AM; Clinic ends at 9 AM.  Patients are seen on a first-come first-served basis, and a limited number are seen during each clinic.   [facility]  [address] Ether Griffins [place], [place] [phone]   Eligibility Requirements You must have lived in [place], [place], or [place] counties for at least the last three months.   You cannot be eligible for state or federal sponsored [place], including [employer], [place], or [employer].   You generally cannot be eligible for healthcare insurance through your employer.    How to apply: Eligibility screenings are held every Tuesday and Wednesday afternoon from 1:00 pm until 4:00 pm. You do not need an appointment for the interview!  [facility] [address] [place], [place] [phone]   [facility] Health Department  [phone]   [facility] Health Department  [phone]   [facility] Health Department  [phone]    Behavioral Health Resources in the Community: Intensive Outpatient Programs Organization         Address  Phone  Notes  [facility] Services [address] [place], [place] [phone]   [facility] Outpatient [address], [place], [place] [phone]   [facility] [address],  [place], [place]  [phone]   [facility] [address]  [place], [place] [phone] or [phone]   Substance Abuse Resources Organization         Address  Phone  Notes  Al[facility] Drug Services  [phone]   [facility]  [phone]   [facility] [place]  [phone]   Floydene Flock  [phone]   Residential & Outpatient Substance Abuse Program  [phone]   Psychological Services Organization         Address  Phone  Notes  [facility]  [phone]   [facility]  [phone]   [facility] Mental Health [address], [place] [phone] or [phone]    Mobile Crisis Teams Organization         Address  Phone  Notes  [facility], Mobile Crisis Care Unit  [phone]   [facility]  [address] [place], [place] [phone]   Doristine Locks [address] [place] [phone]    Self-Help/Support Groups Organization         Address  Phone             Notes  Mental Health Assoc. of  - variety of support groups  336- I7437963 Call for more information  Narcotics Anonymous (NA), Caring [facility] [address], [place]  2 meetings at this location   [employer]  Phone  Notes  [facility] [address]    [place]  [phone]   [facility]  [address] [place] [address], [place] [address], [place], [place] [phone]   [facility] [address] [place], [place] [phone] Admissions: 8am-3pm M-F  [facility] [address]    [place], [place] [phone]   [facility] [address] [place], [place], [place] [phone]   [facility] [address],  [place], [place] [phone]   [facility] - Intensive Outpatient [address] Laurell Josephs [address], [place], [place] [phone]   [facility]  (Addiction Recovery [facility] [address] [place].,  [place], [place] [phone] or [phone]   [facility] ([facility]) [address] [place], [place] [phone] Accepts Medicaid  [facility] [address],  [place] [phone] Substance Abuse/Addiction Treatment   [facility] Organization         Address  Phone  Notes  [facility]  [phone]   Angie Fava, PhD [address] Ervin Knack [place], [place]   [phone] or [phone]   [facility] Behavioral   [address] [place], [place] [phone]   [facility] [address], [place], [place] [phone] Insurance/Medicaid/sponsorship through [facility] [address]                                    [place], [place] [phone] Therapy/tele-psych/case  [facility] [address] [place], [place] [phone]    Dr. Lolly Mustache  [phone]   [facility]  [employer] [facility] Dept. 1) [address], [place] 2) [address] [place] 3)  [address], [place] [phone]  [phone]   [facility] Child Abuse Hotline (240) 710-0211 or [phone])[phone] (After Hours)

## 2013-09-25 NOTE — ED Notes (Signed)
Pt in CT scan at the time 

## 2013-09-25 NOTE — ED Notes (Signed)
Family at bedside. Pt resting in bed. Pt provided with water and asked for urine sample.

## 2013-09-25 NOTE — ED Notes (Signed)
Pt presents to department via [facility]. Seizure witnessed by wife this morning, history of seizure and stroke. R sided deficits noted from previous stroke. Oral trauma noted, swelling to R eye. Has not been taking seizure medications as prescribed. Pt combative per EMS, given 5mg  versed IM. Pt resting quietly upon arrival.

## 2013-09-25 NOTE — ED Notes (Signed)
Pt able to ambulate with standby assist. Family at bedside.

## 2013-09-29 ENCOUNTER — Emergency Department (INDEPENDENT_AMBULATORY_CARE_PROVIDER_SITE_OTHER): Payer: Medicaid Other

## 2013-09-29 ENCOUNTER — Encounter (HOSPITAL_COMMUNITY): Payer: Self-pay | Admitting: Emergency Medicine

## 2013-09-29 ENCOUNTER — Emergency Department (HOSPITAL_COMMUNITY)
Admission: EM | Admit: 2013-09-29 | Discharge: 2013-09-29 | Disposition: A | Discharge status: Home / Self Care | Payer: Medicaid Other | Source: Home / Self Care | Attending: Emergency Medicine | Admitting: Emergency Medicine

## 2013-09-29 DIAGNOSIS — T148XXA Other injury of unspecified body region, initial encounter: Secondary | ICD-10-CM

## 2013-09-29 DIAGNOSIS — J189 Pneumonia, unspecified organism: Secondary | ICD-10-CM

## 2013-09-29 DIAGNOSIS — Z4802 Encounter for removal of sutures: Secondary | ICD-10-CM

## 2013-09-29 DIAGNOSIS — IMO0002 Reserved for concepts with insufficient information to code with codable children: Secondary | ICD-10-CM

## 2013-09-29 HISTORY — DX: Paralytic syndrome, unspecified: G83.9

## 2013-09-29 NOTE — Discharge Instructions (Signed)
Mouth Injury  Cuts and scrapes inside the mouth are common from falls or bites. They tend to bleed a lot. Most mouth injuries heal quickly.   HOME CARE   See your dentist right away if teeth are broken. Take all broken pieces with you to the dentist.   Press on the bleeding site with a germ free (sterile) gauze or piece of clean cloth. This will help stop the bleeding.   Cold drinks or ice will help keep the puffiness (swelling) down.   Gargle with warm salt water after 1 day. Put 1 teaspoon of salt into 1 cup of warm water.   Only take medicine as told by your doctor.   Eat soft foods until healing is complete.   Avoid any salty or citrus foods. They may sting your mouth.   Rinse your mouth with warm water after meals.  GET HELP RIGHT AWAY IF:    You have a large amount of bleeding that will not stop.   You have severe pain.   You have trouble swallowing.   Your mouth becomes infected.   You have a fever.  MAKE SURE YOU:    Understand these instructions.   Will watch your condition.   Will get help right away if you are not doing well or get worse.  Document Released: 10/08/2009 Document Revised: 10/06/2011 Document Reviewed: 10/08/2009  ExitCare Patient Information 2014 [employer].

## 2013-09-29 NOTE — ED Notes (Signed)
Patient here for follow up visit to evaluate pneumonia and suture removal .  09/25/13 was original date of visit.

## 2013-09-29 NOTE — ED Provider Notes (Signed)
Chief Complaint   Chief Complaint  Patient presents with  . Suture / Staple Removal  . Pneumonia    History of Present Illness   Jim HailRobert L Udell is a 48 year old male with a history of CVA and seizures. He presented to the emergency room on March 1 with a history of seizure. He had bitten his upper lip. He underwent CT scanning and a chest x-ray. The chest x-ray showed either atelectasis or pneumonia at the left base. He was placed on a Z-Pak and returns for a followup chest x-ray. He also returns for followup of the laceration and suture removal. It is healing up well. There is no evidence of infection. No further seizures, no new neurological symptoms. He is accompanied today by his daughter who takes care of him.  He's unable to give any history and is essentially nonverbal.  Review of Systems   Other than as noted above, the patient denies any of the following symptoms: Eye:  No eye pain, diplopia or blurred vision ENT:  No bleeding from nose or ears.  No loose or broken teeth. Neck:  No pain or limited ROM. GI:  No nausea or vomiting. Neuro:  No loss of consciousness, seizure activity, numbness, tingling, or weakness.  PMFSH   Past medical history, family history, social history, meds, and allergies were reviewed.  Current meds include azithromycin, Lexapro, and Keppra.  Physical Examination   Vital signs:  BP 117/93  Pulse 72  Temp(Src) 97.8 F (36.6 C) (Oral)  Resp 16  SpO2 96% General:  Alert and oriented times 3.  In no distress. Eye:  PERRL, full EOMs.  Lids and conjunctivas normal. HEENT:  He has a laceration of his upper lip was sutured, although only one of the 3 sutures are still in place. It's healing up well without any evidence of infection.  TMs and canals normal, nasal mucosa normal.  No oral lacerations.  Teeth were intact without obvious oral trauma. Neck:  Non tender.  Full ROM without pain. Neurological:  Alert but nonverbal. He's only able to repeat a  certain phrase over and over again, but he does seem to understand what is said.  Cranial nerves intact.  No pronator drift.  No muscle weakness.   Procedure   Verbal informed consent was obtained.  The patient was informed of the risks and benefits of the procedure and understands and accepts.  A time out was called and the identitiy of the patient and correct procedure were verified. Sutures were removed. Wound is well-healed.  Radiology   Dg Chest 2 View  09/29/2013   CLINICAL DATA:  Followup pneumonia  EXAM: CHEST  2 VIEW  COMPARISON:  09/25/2013  FINDINGS: The heart size and mediastinal contours are within normal limits. Both lungs are clear. Resolution of previous left base opacity. The visualized skeletal structures are unremarkable.  IMPRESSION: No active cardiopulmonary disease.   Electronically Signed   By: Signa Kellaylor  Stroud M.D.   On: 09/29/2013 19:28   I reviewed the images independently and personally and concur with the radiologist's findings.  Assessment   The primary encounter diagnosis was Laceration. A diagnosis of Community acquired pneumonia was also pertinent to this visit.  His pneumonia seems to be cleared up completely. His lip is healed up. No need to return unless he has any further problems.  Plan   1.  Meds:  The following meds were prescribed:   Discharge Medication List as of 09/29/2013  7:33 PM  2.  Patient Education/Counseling:  The patient was given appropriate handouts, self care instructions, and instructed in symptomatic relief. Instructions were given for wound care.    3.  Follow up:  The patient was told to follow up immediately if there is any sign of infection.  Reuben Likes, MD 09/29/13 [phone]

## 2013-10-23 ENCOUNTER — Emergency Department (HOSPITAL_COMMUNITY)
Admission: EM | Admit: 2013-10-23 | Discharge: 2013-10-23 | Disposition: A | Discharge status: Home / Self Care | Payer: Medicaid Other | Source: Home / Self Care | Attending: Family Medicine | Admitting: Family Medicine

## 2013-10-23 ENCOUNTER — Encounter (HOSPITAL_COMMUNITY): Payer: Self-pay | Admitting: Emergency Medicine

## 2013-10-23 DIAGNOSIS — F329 Major depressive disorder, single episode, unspecified: Secondary | ICD-10-CM

## 2013-10-23 DIAGNOSIS — M62838 Other muscle spasm: Secondary | ICD-10-CM | POA: Diagnosis present

## 2013-10-23 DIAGNOSIS — R4701 Aphasia: Secondary | ICD-10-CM | POA: Diagnosis present

## 2013-10-23 DIAGNOSIS — G40909 Epilepsy, unspecified, not intractable, without status epilepticus: Secondary | ICD-10-CM

## 2013-10-23 DIAGNOSIS — Z8673 Personal history of transient ischemic attack (TIA), and cerebral infarction without residual deficits: Secondary | ICD-10-CM

## 2013-10-23 DIAGNOSIS — F32A Depression, unspecified: Secondary | ICD-10-CM

## 2013-10-23 DIAGNOSIS — F3289 Other specified depressive episodes: Secondary | ICD-10-CM

## 2013-10-23 MED ORDER — ESCITALOPRAM OXALATE 20 MG PO TABS: 20.0000 mg | ORAL_TABLET | Freq: Every day | ORAL | Status: AC

## 2013-10-23 MED ORDER — LEVETIRACETAM 500 MG PO TABS: 500.0000 mg | ORAL_TABLET | Freq: Two times a day (BID) | ORAL | Status: DC

## 2013-10-23 MED ORDER — BACLOFEN 20 MG PO TABS: 20.0000 mg | ORAL_TABLET | Freq: Four times a day (QID) | ORAL | Status: AC

## 2013-10-23 NOTE — ED Provider Notes (Signed)
Jim Fields is a 48 y.o. male who presents to Urgent Care today for medication refill. Patient has a history of a stroke several years ago that resulted in right-sided weakness and aphasia.  He feels pretty well but is having worsening right sided muscle pain as a result of running out of baclofen. He is here for refills of his medication. He is attempting to get his Medicaid switched [place] to somebody [place] in [place]. He denies any fevers chills nausea vomiting or diarrhea. He is in his normal state of health. He has difficulty communicating due to his aphasia. He'll only is able to say "good boy" however he understands what I am saying. He also requires refills of his Keppra and Lexapro. He is not run out of his medications yet but will do soon.    Past Medical History  Diagnosis Date  . Stroke   . Seizures   . 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 Smoking status: Current Every Day Smoker  . Smokeless tobacco: Not on file  . Alcohol Use: No   ROS as above Medications: No current facility-administered medications for this encounter.   Current Outpatient Prescriptions  Medication Sig Dispense Refill  . baclofen (LIORESAL) 20 MG tablet Take 1 tablet (20 mg total) by mouth 4 (four) times daily.  120 tablet  1  . escitalopram (LEXAPRO) 20 MG tablet Take 1 tablet (20 mg total) by mouth daily.  30 tablet  1  . levETIRAcetam (KEPPRA) 500 MG tablet Take 1 tablet (500 mg total) by mouth 2 (two) times daily.  60 tablet  1  . Multiple Vitamin (MULTIVITAMIN) capsule Take 1 capsule by mouth daily.        Exam:  BP 135/89  Pulse 83  Temp(Src) 98.9 F (37.2 C) (Oral)  Resp 20  SpO2 96% Gen: Well NAD HEENT: EOMI,  MMM Lungs: Normal work of breathing. CTABL Heart: RRR no MRG Abd: NABS, Soft. NT, ND no masses palpated Exts: Brisk capillary refill, warm and well perfused.  MSK: Right hemiparesis with muscle  spasm.  No results found for this or any previous visit (from the past 24 hour(s)). No results found.  Assessment and Plan: 48 y.o. male with  1) muscle spasm: Secondary to stroke. Refill baclofen 2) epilepsy: Refill Keppra 3) depression: Refill Lexapro.   Refer patient to social worker here at [facility] for assistance with his Medicaid.  Discussed warning signs or symptoms. Please see discharge instructions. Patient expresses understanding.    Rodolph BongEvan S Corey, MD 10/23/13 1037

## 2013-10-23 NOTE — ED Notes (Signed)
Pt  Reports      Pain r  Side    Worse  When he  Moves  Takes  A  Deep breath         Or  coughgs   Pt  Is  Almost  Out  Of  His  mainatnce  meds  And  Needs  Refills              He is  Aphasic  From  An  Old  cva  In past

## 2013-10-23 NOTE — Discharge Instructions (Signed)
Thank you for coming in today. Restart the medicine.  Come back as needed Work with DSS to get the medicaid switched to here.   Please call or see Ms Antionette CharMaggy Mena for assistance with your bill.  You may qualify for reduced or free services.  Her phone number is [phone]. Her email is [email or web address]

## 2014-04-16 ENCOUNTER — Encounter (HOSPITAL_BASED_OUTPATIENT_CLINIC_OR_DEPARTMENT_OTHER): Payer: Medicaid Other

## 2014-08-08 ENCOUNTER — Encounter (HOSPITAL_COMMUNITY): Payer: Self-pay | Admitting: Emergency Medicine

## 2014-08-08 ENCOUNTER — Emergency Department (HOSPITAL_COMMUNITY): Payer: Medicaid Other

## 2014-08-08 ENCOUNTER — Emergency Department (HOSPITAL_COMMUNITY)
Admission: EM | Admit: 2014-08-08 | Discharge: 2014-08-08 | Disposition: A | Discharge status: Home / Self Care | Payer: Medicaid Other | Attending: Emergency Medicine | Admitting: Emergency Medicine

## 2014-08-08 DIAGNOSIS — Z79899 Other long term (current) drug therapy: Secondary | ICD-10-CM | POA: Diagnosis not present

## 2014-08-08 DIAGNOSIS — F10929 Alcohol use, unspecified with intoxication, unspecified: Secondary | ICD-10-CM | POA: Diagnosis not present

## 2014-08-08 DIAGNOSIS — Z9119 Patient's noncompliance with other medical treatment and regimen: Secondary | ICD-10-CM | POA: Diagnosis not present

## 2014-08-08 DIAGNOSIS — Z7289 Other problems related to lifestyle: Secondary | ICD-10-CM

## 2014-08-08 DIAGNOSIS — Z720 Tobacco use: Secondary | ICD-10-CM | POA: Diagnosis not present

## 2014-08-08 DIAGNOSIS — Z8673 Personal history of transient ischemic attack (TIA), and cerebral infarction without residual deficits: Secondary | ICD-10-CM | POA: Insufficient documentation

## 2014-08-08 DIAGNOSIS — G40909 Epilepsy, unspecified, not intractable, without status epilepticus: Secondary | ICD-10-CM | POA: Insufficient documentation

## 2014-08-08 DIAGNOSIS — F1290 Cannabis use, unspecified, uncomplicated: Secondary | ICD-10-CM | POA: Diagnosis not present

## 2014-08-08 DIAGNOSIS — R569 Unspecified convulsions: Secondary | ICD-10-CM | POA: Diagnosis present

## 2014-08-08 DIAGNOSIS — R609 Edema, unspecified: Secondary | ICD-10-CM | POA: Insufficient documentation

## 2014-08-08 DIAGNOSIS — Z789 Other specified health status: Secondary | ICD-10-CM

## 2014-08-08 DIAGNOSIS — F1090 Alcohol use, unspecified, uncomplicated: Secondary | ICD-10-CM

## 2014-08-08 DIAGNOSIS — Z9114 Patient's other noncompliance with medication regimen: Secondary | ICD-10-CM

## 2014-08-08 DIAGNOSIS — Z91148 Patient's other noncompliance with medication regimen for other reason: Secondary | ICD-10-CM

## 2014-08-08 LAB — CBC WITH DIFFERENTIAL/PLATELET
Basophils Absolute: 0 10*3/uL (ref 0.0–0.1)
Basophils Relative: 0 % (ref 0–1)
Eosinophils Absolute: 0.3 10*3/uL (ref 0.0–0.7)
Eosinophils Relative: 3 % (ref 0–5)
HCT: 45.6 % (ref 39.0–52.0)
Hemoglobin: 16.1 g/dL (ref 13.0–17.0)
Lymphocytes Relative: 15 % (ref 12–46)
Lymphs Abs: 1.5 10*3/uL (ref 0.7–4.0)
MCH: 31.6 pg (ref 26.0–34.0)
MCHC: 35.3 g/dL (ref 30.0–36.0)
MCV: 89.6 fL (ref 78.0–100.0)
Monocytes Absolute: 0.6 10*3/uL (ref 0.1–1.0)
Monocytes Relative: 6 % (ref 3–12)
Neutro Abs: 7.6 10*3/uL (ref 1.7–7.7)
Neutrophils Relative %: 76 % (ref 43–77)
Platelets: 146 10*3/uL — ABNORMAL LOW (ref 150–400)
RBC: 5.09 MIL/uL (ref 4.22–5.81)
RDW: 11.9 % (ref 11.5–15.5)
WBC: 10 10*3/uL (ref 4.0–10.5)

## 2014-08-08 LAB — RAPID URINE DRUG SCREEN, HOSP PERFORMED
Amphetamines: NOT DETECTED
Barbiturates: NOT DETECTED
Benzodiazepines: NOT DETECTED
Cocaine: NOT DETECTED
Opiates: NOT DETECTED
Tetrahydrocannabinol: POSITIVE — AB

## 2014-08-08 LAB — BASIC METABOLIC PANEL
Anion gap: 6 (ref 5–15)
BUN: 7 mg/dL (ref 6–23)
CO2: 22 mmol/L (ref 19–32)
Calcium: 8.7 mg/dL (ref 8.4–10.5)
Chloride: 108 mEq/L (ref 96–112)
Creatinine, Ser: 0.95 mg/dL (ref 0.50–1.35)
GFR calc Af Amer: >90 mL/min (ref >90)
GFR calc non Af Amer: >90 mL/min (ref >90)
Glucose, Bld: 86 mg/dL (ref 70–99)
Potassium: 3.8 mmol/L (ref 3.5–5.1)
Sodium: 136 mmol/L (ref 135–145)

## 2014-08-08 LAB — URINALYSIS, ROUTINE W REFLEX MICROSCOPIC
Bilirubin Urine: NEGATIVE
Glucose, UA: NEGATIVE mg/dL
Hgb urine dipstick: NEGATIVE
Ketones, ur: NEGATIVE mg/dL
Leukocytes, UA: NEGATIVE
Nitrite: NEGATIVE
Protein, ur: NEGATIVE mg/dL
Specific Gravity, Urine: 1.01 (ref 1.005–1.030)
Urobilinogen, UA: 0.2 mg/dL (ref 0.0–1.0)
pH: 6 (ref 5.0–8.0)

## 2014-08-08 LAB — ETHANOL: Alcohol, Ethyl (B): <5 mg/dL (ref 0–9)

## 2014-08-08 LAB — CBG MONITORING, ED: GLUCOSE-CAPILLARY: 92 mg/dL (ref 70–99)

## 2014-08-08 MED ORDER — LEVETIRACETAM 500 MG PO TABS
500.0000 mg | ORAL_TABLET | Freq: Once | ORAL | Status: AC
  Administered 2014-08-08: 500 mg via ORAL
  Filled 2014-08-08: qty 1

## 2014-08-08 NOTE — ED Notes (Signed)
Per EMS pt was at home and was standing when he fell back and"started having seizure like activity" per wife. Pt has a hx of seizures and takes medication but EMS states pt has been drinking a 12 pack of beer everyday for the last week while taking. PT has hx of CVA and has Rt sided weakness and only words he is able to speak is " Yea boy". Per ems Pt wife states that is baseline. Vitals with EMS CBG 123 BP 124/88 HR 90

## 2014-08-08 NOTE — Discharge Instructions (Signed)
Alcohol Use Disorder Alcohol use disorder is a mental disorder. It is not a one-time incident of heavy drinking. Alcohol use disorder is the excessive and uncontrollable use of alcohol over time that leads to problems with functioning in one or more areas of daily living. People with this disorder risk harming themselves and others when they drink to excess. Alcohol use disorder also can cause other mental disorders, such as mood and anxiety disorders, and serious physical problems. People with alcohol use disorder often misuse other drugs.  Alcohol use disorder is common and widespread. Some people with this disorder drink alcohol to cope with or escape from negative life events. Others drink to relieve chronic pain or symptoms of mental illness. People with a family history of alcohol use disorder are at higher risk of losing control and using alcohol to excess.  SYMPTOMS  Signs and symptoms of alcohol use disorder may include the following:   Consumption ofalcohol inlarger amounts or over a longer period of time than intended.  Multiple unsuccessful attempts to cutdown or control alcohol use.   A great deal of time spent obtaining alcohol, using alcohol, or recovering from the effects of alcohol (hangover).  A strong desire or urge to use alcohol (cravings).   Continued use of alcohol despite problems at work, school, or home because of alcohol use.   Continued use of alcohol despite problems in relationships because of alcohol use.  Continued use of alcohol in situations when it is physically hazardous, such as driving a car.  Continued use of alcohol despite awareness of a physical or psychological problem that is likely related to alcohol use. Physical problems related to alcohol use can involve the brain, heart, liver, stomach, and intestines. Psychological problems related to alcohol use include intoxication, depression, anxiety, psychosis, delirium, and dementia.   The need for  increased amounts of alcohol to achieve the same desired effect, or a decreased effect from the consumption of the same amount of alcohol (tolerance).  Withdrawal symptoms upon reducing or stopping alcohol use, or alcohol use to reduce or avoid withdrawal symptoms. Withdrawal symptoms include:  Racing heart.  Hand tremor.  Difficulty sleeping.  Nausea.  Vomiting.  Hallucinations.  Restlessness.  Seizures. DIAGNOSIS Alcohol use disorder is diagnosed through an assessment by your health care provider. Your health care provider may start by asking three or four questions to screen for excessive or problematic alcohol use. To confirm a diagnosis of alcohol use disorder, at least two symptoms must be present within a 12-month period. The severity of alcohol use disorder depends on the number of symptoms:  Mild--two or three.  Moderate--four or five.  Severe--six or more. Your health care provider may perform a physical exam or use results from lab tests to see if you have physical problems resulting from alcohol use. Your health care provider may refer you to a mental health professional for evaluation. TREATMENT  Some people with alcohol use disorder are able to reduce their alcohol use to low-risk levels. Some people with alcohol use disorder need to quit drinking alcohol. When necessary, mental health professionals with specialized training in substance use treatment can help. Your health care provider can help you decide how severe your alcohol use disorder is and what type of treatment you need. The following forms of treatment are available:   Detoxification. Detoxification involves the use of prescription medicines to prevent alcohol withdrawal symptoms in the first week after quitting. This is important for people with a history of symptoms   of withdrawal and for heavy drinkers who are likely to have withdrawal symptoms. Alcohol withdrawal can be dangerous and, in severe cases, cause  death. Detoxification is usually provided in a hospital or in-patient substance use treatment facility.  Counseling or talk therapy. Talk therapy is provided by substance use treatment counselors. It addresses the reasons people use alcohol and ways to keep them from drinking again. The goals of talk therapy are to help people with alcohol use disorder find healthy activities and ways to cope with life stress, to identify and avoid triggers for alcohol use, and to handle cravings, which can cause relapse.  Medicines.Different medicines can help treat alcohol use disorder through the following actions:  Decrease alcohol cravings.  Decrease the positive reward response felt from alcohol use.  Produce an uncomfortable physical reaction when alcohol is used (aversion therapy).  Support groups. Support groups are run by people who have quit drinking. They provide emotional support, advice, and guidance. These forms of treatment are often combined. Some people with alcohol use disorder benefit from intensive combination treatment provided by specialized substance use treatment centers. Both inpatient and outpatient treatment programs are available. Document Released: 08/21/2004 Document Revised: 11/28/2013 Document Reviewed: 10/21/2012 Exit[employer] Patient Information 2015 [employer]. This information is not intended to replace advice given to you by your health care provider. Make sure you discuss any questions you have with your health care provider. Alcohol and Nutrition Nutrition serves two purposes. It provides energy. It also maintains body structure and function. Food supplies energy. It also provides the building blocks needed to replace worn or damaged cells. Alcoholics often eat poorly. This limits their supply of essential nutrients. This affects energy supply and structure maintenance. Alcohol also affects the body's nutrients in:  Digestion.  Storage.  Using and getting rid of waste  products. IMPAIRMENT OF NUTRIENT DIGESTION AND UTILIZATION   Once ingested, food must be broken down into small components (digested). Then it is available for energy. It helps maintain body structure and function. Digestion begins in the mouth. It continues in the stomach and intestines, with help from the pancreas. The nutrients from digested food are absorbed from the intestines into the blood. Then they are carried to the liver. The liver prepares nutrients for:  Immediate use.  Storage and future use.  Alcohol inhibits the breakdown of nutrients into usable molecules.  It decreases secretion of digestive enzymes from the pancreas.  Alcohol impairs nutrient absorption by damaging the cells lining the stomach and intestines.  It also interferes with moving some nutrients into the blood.  In addition, nutritional deficiencies themselves may lead to further absorption problems.  For example, folate deficiency changes the cells that line the small intestine. This impairs how water is absorbed. It also affects absorbed nutrients. These include glucose, sodium, and additional folate.  Even if nutrients are digested and absorbed, alcohol can prevent them from being fully used. It changes their transport, storage, and excretion. Impaired utilization of nutrients by alcoholics is indicated by:  Decreased liver stores of vitamins, such as vitamin A.  Increased excretion of nutrients such as fat. ALCOHOL AND ENERGY SUPPLY   Three basic nutritional components found in food are:  Carbohydrates.  Proteins.  Fats.  These are used as energy. Some alcoholics take in as much as 50% of their total daily calories from alcohol. They often neglect important foods.  Even when enough food is eaten, alcohol can impair the ways the body controls blood sugar (glucose) levels. It may   either increase or decrease blood sugar.  In non-diabetic alcoholics, increased blood sugar (hyperglycemia) is caused  by poor insulin secretion. It is usually temporary.  Decreased blood sugar (hypoglycemia) can cause serious injury even if this condition is short-lived. Low blood sugar can happen when a fasting or malnourished person drinks alcohol. When there is no food to supply energy, stored sugar is used up. The products of alcohol inhibit forming glucose from other compounds such as amino acids. As a result, alcohol causes the brain and other body tissue to lack glucose. It is needed for energy and function.  Alcohol is an energy source. But how the body processes and uses the energy from alcohol is complex. Also, when alcohol is substituted for carbohydrates, subjects tend to lose weight. This indicates that they get less energy from alcohol than from food. ALCOHOL - MAINTAINING CELL STRUCTURE AND FUNCTION  Structure Cells are made mostly of protein. So an adequate protein diet is important for maintaining cell structure. This is especially true if cells are being damaged. Research indicates that alcohol affects protein nutrition by causing impaired:  Digestion of proteins to amino acids.  Processing of amino acids by the small intestine and liver.  Synthesis of proteins from amino acids.  Protein secretion by the liver. Function Nutrients are essential for the body to function well. They provide the tools that the body needs to work well:   Proteins.  Vitamins.  Minerals. Alcohol can disrupt body function. It may cause nutrient deficiencies. And it may interfere with the way nutrients are processed. Vitamins  Vitamins are essential to maintain growth and normal metabolism. They regulate many of the body`s processes. Chronic heavy drinking causes deficiencies in many vitamins. This is caused by eating less. And, in some cases, vitamins may be poorly absorbed. For example, alcohol inhibits fat absorption. It impairs how the vitamins A, E, and D are normally absorbed along with dietary fats. Not  enough vitamin A may cause night blindness. Not enough vitamin D may cause softening of the bones.  Some alcoholics lack vitamins A, C, D, E, K, and the B vitamins. These are all involved in wound healing and cell maintenance. In particular, because vitamin K is necessary for blood clotting, lacking that vitamin can cause delayed clotting. The result is excess bleeding. Lacking other vitamins involved in brain function may cause severe neurological damage. Minerals Deficiencies of minerals such as calcium, magnesium, iron, and zinc are common in alcoholics. The alcohol itself does not seem to affect how these minerals are absorbed. Rather, they seem to occur secondary to other alcohol-related problems, such as:  Less calcium absorbed.  Not enough magnesium.  More urinary excretion.  Vomiting.  Diarrhea.  Not enough iron due to gastrointestinal bleeding.  Not enough zinc or losses related to other nutrient deficiencies.  Mineral deficiencies can cause a variety of medical consequences. These range from calcium-related bone disease to zinc-related night blindness and skin lesions. ALCOHOL, MALNUTRITION, AND MEDICAL COMPLICATIONS  Liver Disease   Alcoholic liver damage is caused primarily by alcohol itself. But poor nutrition may increase the risk of alcohol-related liver damage. For example, nutrients normally found in the liver are known to be affected by drinking alcohol. These include carotenoids, which are the major sources of vitamin A, and vitamin E compounds. Decreases in such nutrients may play some role in alcohol-related liver damage. Pancreatitis  Research suggests that malnutrition may increase the risk of developing alcoholic pancreatitis. Research suggests that a diet lacking in   protein may increase alcohol's damaging effect on the pancreas. Brain  Nutritional deficiencies may have severe effects on brain function. These may be permanent. Specifically, thiamine deficiencies  are often seen in alcoholics. They can cause severe neurological problems. These include:  Impaired movement.  Memory loss seen in Wernicke-Korsakoff syndrome. Pregnancy  Alcohol has toxic effects on fetal development. It causes alcohol-related birth defects. They include fetal alcohol syndrome. Alcohol itself is toxic to the fetus. Also, the nutritional deficiency can affect how the fetus develops. That may compound the risk of developmental damage.  Nutritional needs during pregnancy are 10% to 30% greater than normal. Food intake can increase by as much as 140% to cover the needs of both mother and fetus. An alcoholic mother`s nutritional problems may adversely affect the nutrition of the fetus. And alcohol itself can also restrict nutrition flow to the fetus. NUTRITIONAL STATUS OF ALCOHOLICS  Techniques for assessing nutritional status include:  Taking body measurements to estimate fat reserves. They include:  Weight.  Height.  Mass.  Skin fold thickness.  Performing blood analysis to provide measurements of circulating:  Proteins.  Vitamins.  Minerals.  These techniques tend to be imprecise. For many nutrients, there is no clear "cut-off" point that would allow an accurate definition of deficiency. So assessing the nutritional status of alcoholics is limited by these techniques. Dietary status may provide information about the risk of developing nutritional problems. Dietary status is assessed by:  Taking patients' dietary histories.  Evaluating the amount and types of food they are eating.  It is difficult to determine what exact amount of alcohol begins to have damaging effects on nutrition. In general, moderate drinkers have 2 drinks or less per day. They seem to be at little risk for nutritional problems. Various medical disorders begin to appear at greater levels.  Research indicates that the majority of even the heaviest drinkers have few obvious nutritional  deficiencies. Many alcoholics who are hospitalized for medical complications of their disease do have severe malnutrition. Alcoholics tend to eat poorly. Often they eat less than the amounts of food necessary to provide enough:  Carbohydrates.  Protein.  Fat.  Vitamins A and C.  B vitamins.  Minerals like calcium and iron. Of major concern is alcohol's effect on digesting food and use of nutrients. It may shift a mildly malnourished person toward severe malnutrition. Document Released: 05/08/2005 Document Revised: 10/06/2011 Document Reviewed: 10/22/2005 [employer] Patient Information 2015 [employer]. This information is not intended to replace advice given to you by your health care provider. Make sure you discuss any questions you have with your health care provider.  

## 2014-08-08 NOTE — ED Provider Notes (Signed)
CSN: [id number]     Arrival date & time 08/08/14  1613 History   First MD Initiated Contact with Patient 08/08/14 1620     Chief Complaint  Patient presents with  . Seizures   LEVEL 5 CAVEAT: hx obtained from family as pt non-verbal. S/p CVA  (Consider location/radiation/quality/duration/timing/severity/associated sxs/prior Treatment) Patient is a 49 y.o. male presenting with seizures. The history is provided by the spouse, the EMS personnel and a relative (pt's ex-wife and daughter). No language interpreter was used.  Seizures Seizure activity on arrival: no   Seizure type:  Grand mal Initial focality:  Unable to specify Episode characteristics: abnormal movements, confusion, generalized shaking, tongue biting and unresponsiveness   Episode characteristics: no incontinence   Return to baseline: yes   Severity:  Moderate Duration:  5 minutes Timing:  Once Progression:  Resolved Context comment:  Family reports pt has been drinking 12 pack of beer daily for 1 week with his keppra Recent head injury: per wife, pt was standing, fell backward then started having seizure-like activity. PTA treatment:  None History of seizures: yes   Similar to previous episodes: unknown: ex-wife and daughter state they have never been around pt when he has seized.   Date of most recent prior episode:  09/25/13 Severity:  Unable to specify Seizure control level:  Well controlled (last seizure was about 1 year ago) Current therapy:  Levetiracetam Compliance with current therapy:  Variable (has been drinking beer with his medications. ex wife states it does not appear pt has been taking medication as prescribed as he should be out by now but there are several tabs left)  Per family pt is back to baseline, usually non-verbal due to CVA several years ago as well as had right sided weakness.  Daughter states pt was found to have his right arm bent behind him and is concerned his right hand appears to be more  swollen than usual.    Pt moved from [place] 1 year ago to [place] area with his ex-wife.  Pt does not have a PCP.  Pt f/u with neurology about once a year per pt's daughter.  No recent follow up. No recent change in medication. Pt does have hx of drug use but "many years ago" per daughter.    Past Medical History  Diagnosis Date  . Stroke   . Seizures   . 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 Back surgery     No family history on file. History  Substance Use Topics  . Smoking status: Current Every Day Smoker  . Smokeless tobacco: Not on file  . Alcohol Use: No    Review of Systems  Unable to perform ROS: Patient nonverbal  Neurological: Positive for seizures.  Pt only states "its boy, its boy"- Chronic per family, s/p CVA   Allergies  Review of patient's allergies indicates no known allergies.  Home Medications   Prior to Admission medications   Medication Sig Start Date End Date Taking? Authorizing Provider  levETIRAcetam (KEPPRA) 500 MG tablet Take 1 tablet (500 mg total) by mouth 2 (two) times daily. 10/23/13  Yes Rodolph Bong, MD  baclofen (LIORESAL) 20 MG tablet Take 1 tablet (20 mg total) by mouth 4 (four) times daily. 10/23/13   Rodolph Bong, MD  escitalopram (LEXAPRO) 20 MG tablet Take 1 tablet (20 mg total) by mouth daily. 10/23/13  Rodolph BongEvan S Corey, MD   BP 129/85 mmHg  Pulse 83  Temp(Src) 97.7 F (36.5 C) (Oral)  Resp 20  SpO2 96% Physical Exam  Constitutional: He appears well-developed and well-nourished.  HENT:  Head: Normocephalic and atraumatic.  Small amount of dried blood on lips. Blood appears to have come from tongue w/o large laceration. No dental injury.  Eyes: Conjunctivae and EOM are normal. Pupils are equal, round, and reactive to light. No scleral icterus.  Neck: Normal range of motion. Neck supple.  Cardiovascular: Normal rate, regular rhythm and normal  heart sounds.   Pulmonary/Chest: Effort normal and breath sounds normal. No respiratory distress. He has no wheezes. He has no rales. He exhibits no tenderness.  Abdominal: Soft. Bowel sounds are normal. He exhibits no distension and no mass. There is no tenderness. There is no rebound and no guarding.  Musculoskeletal: Normal range of motion. He exhibits edema.  Right upper extremity: Limited ROM right arm s/p CVA (chronic) edema to right hand and wrist w/o deformity.  Neurological: He is alert.  Alert, looking around room, attempted to write using pen and paper- incomprehensible writing/drawing. When asked questions pt states "it's boy, its boy"  (baseline per family) Right arm and leg weakness (chronic per family due to CVA).  Skin: Skin is warm and dry.  Nursing note and vitals reviewed.   ED Course  Procedures (including critical care time) Labs Review Labs Reviewed  CBC WITH DIFFERENTIAL - Abnormal; Notable for the following:    Platelets 146 (*)    All other components within normal limits  URINE RAPID DRUG SCREEN (HOSP PERFORMED) - Abnormal; Notable for the following:    Tetrahydrocannabinol POSITIVE (*)    All other components within normal limits  URINALYSIS, ROUTINE W REFLEX MICROSCOPIC - Abnormal; Notable for the following:    APPearance HAZY (*)    All other components within normal limits  BASIC METABOLIC PANEL  ETHANOL  CBG MONITORING, ED    Imaging Review Dg Forearm Right  08/08/2014   CLINICAL DATA:  Right upper extremity pain.  EXAM: RIGHT FOREARM - 2 VIEW  COMPARISON:  None.  FINDINGS: There is no evidence of fracture or other focal bone lesions. Soft tissues are unremarkable.  IMPRESSION: Normal right forearm.   Electronically Signed   By: Roque LiasJames  Green M.D.   On: 08/08/2014 17:36   Ct Head Wo Contrast  08/08/2014   CLINICAL DATA:  Fall with subsequent seizure like activity reported by spouse. History CVA with right-sided weakness.  EXAM: CT HEAD WITHOUT CONTRAST   CT CERVICAL SPINE WITHOUT CONTRAST  TECHNIQUE: Multidetector CT imaging of the head and cervical spine was performed following the standard protocol without intravenous contrast. Multiplanar CT image reconstructions of the cervical spine were also generated.  COMPARISON:  09/25/2013  FINDINGS: CT HEAD FINDINGS  Encephalomalacia from prior left MCA distribution infarct, no change in distribution or appearance from prior.  The cerebellum appears normal. Aside from expected Wallerian degeneration, the brainstem appears normal. Stable mild prominence the left lateral ventricle and third ventricle due to the underlying encephalomalacia.  No intracranial hemorrhage or mass lesion observed. No acute CVA. Chronic bilateral maxillary, ethmoid, and sphenoid sinusitis.  CT CERVICAL SPINE FINDINGS  Cervical spondylosis noted with multilevel uncinate spurring contributing to mild osseous foraminal stenosis on the right at C4-5. Loss of disc height particularly at C6-7. Anterior spurring at the C1- 2 articulation.  No cervical spine fracture or acute subluxation.  IMPRESSION: 1. No acute intracranial or  acute cervical spine findings. 2. Remote left MCA distribution infarct. 3. Chronic paranasal pansinusitis. 4. Cervical spondylosis.   Electronically Signed   By: Herbie Baltimore M.D.   On: 08/08/2014 18:05   Ct Cervical Spine Wo Contrast  08/08/2014   CLINICAL DATA:  Fall with subsequent seizure like activity reported by spouse. History CVA with right-sided weakness.  EXAM: CT HEAD WITHOUT CONTRAST  CT CERVICAL SPINE WITHOUT CONTRAST  TECHNIQUE: Multidetector CT imaging of the head and cervical spine was performed following the standard protocol without intravenous contrast. Multiplanar CT image reconstructions of the cervical spine were also generated.  COMPARISON:  09/25/2013  FINDINGS: CT HEAD FINDINGS  Encephalomalacia from prior left MCA distribution infarct, no change in distribution or appearance from prior.  The  cerebellum appears normal. Aside from expected Wallerian degeneration, the brainstem appears normal. Stable mild prominence the left lateral ventricle and third ventricle due to the underlying encephalomalacia.  No intracranial hemorrhage or mass lesion observed. No acute CVA. Chronic bilateral maxillary, ethmoid, and sphenoid sinusitis.  CT CERVICAL SPINE FINDINGS  Cervical spondylosis noted with multilevel uncinate spurring contributing to mild osseous foraminal stenosis on the right at C4-5. Loss of disc height particularly at C6-7. Anterior spurring at the C1- 2 articulation.  No cervical spine fracture or acute subluxation.  IMPRESSION: 1. No acute intracranial or acute cervical spine findings. 2. Remote left MCA distribution infarct. 3. Chronic paranasal pansinusitis. 4. Cervical spondylosis.   Electronically Signed   By: Herbie Baltimore M.D.   On: 08/08/2014 18:05   Dg Hand Complete Right  08/08/2014   CLINICAL DATA:  Seizure, right arm/hand pain.  EXAM: RIGHT HAND - COMPLETE 3+ VIEW  COMPARISON:  None.  FINDINGS: The bones are under mineralized. The fifth digit is held in flexion at the proximal interphalangeal joint on all views. Remote fracture deformity of the fifth metacarpal is seen. There is no acute fracture. Scattered osteoarthritis throughout the digits. No erosion or periosteal reaction. There is no focal soft tissue abnormality.  IMPRESSION: 1. No acute bony abnormality. 2. Scattered osteoarthritis. Remote fracture of the fifth metacarpal. The fifth digit is held in flexion at the proximal interphalangeal joint on all views.   Electronically Signed   By: Rubye Oaks M.D.   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 Pt had a reported grand-mal seizure today with  reports of fall, pt did hit his head.  Upon arrival to ED, pt at baseline per family.  Pt has been drinking 12 packs of beer with his medications and does not appear to have been taking his medication daily per family.  Family does note increased swelling of right hand, they are concerned for injury from fall.  Labs: significant for tetrahydrocannabinol, otherwise, unremarkable CT head and cervical spine: unremarkable. Plain films Right hand and forearm: no acute injury.  Doubt SAH, recurrent CVA, meningitis, or other emergent process taking place at this time. Seizure likely due to medication non-compliance.  No additional seizures in ED.   Will give pt dose of keppra in ED.  Advised family to ensure pt is taking medication as directed, also discussed with pt dangers of consuming alcohol with or w/o his medications due to hx  of seizures.  Family is comfortable having pt discharged home today. Referral to [facility] and [facility] provided. Home care instructions provided. Return precautions provided. Pt's family verbalized understanding and agreement with tx plan.  Discussed pt with Dr. Madilyn Hook who also examined pt, agrees with assessment and tx plan.   Junius Finner, PA-C 08/08/14 1944  Tilden Fossa, MD 08/08/14 [phone]

## 2014-08-08 NOTE — ED Notes (Signed)
PA at bedside.

## 2014-08-08 NOTE — ED Notes (Signed)
Pt has bite to the tongue.

## 2014-10-23 ENCOUNTER — Encounter (HOSPITAL_COMMUNITY): Payer: Self-pay | Admitting: Emergency Medicine

## 2014-10-23 ENCOUNTER — Emergency Department (HOSPITAL_COMMUNITY): Payer: Medicaid Other

## 2014-10-23 ENCOUNTER — Emergency Department (HOSPITAL_COMMUNITY)
Admission: EM | Admit: 2014-10-23 | Discharge: 2014-10-24 | Disposition: A | Discharge status: Home / Self Care | Payer: Medicaid Other | Attending: Emergency Medicine | Admitting: Emergency Medicine

## 2014-10-23 DIAGNOSIS — Z8673 Personal history of transient ischemic attack (TIA), and cerebral infarction without residual deficits: Secondary | ICD-10-CM | POA: Diagnosis not present

## 2014-10-23 DIAGNOSIS — X58XXXA Exposure to other specified factors, initial encounter: Secondary | ICD-10-CM | POA: Insufficient documentation

## 2014-10-23 DIAGNOSIS — Z79899 Other long term (current) drug therapy: Secondary | ICD-10-CM | POA: Diagnosis not present

## 2014-10-23 DIAGNOSIS — Y998 Other external cause status: Secondary | ICD-10-CM | POA: Insufficient documentation

## 2014-10-23 DIAGNOSIS — Y9389 Activity, other specified: Secondary | ICD-10-CM | POA: Diagnosis not present

## 2014-10-23 DIAGNOSIS — S0993XA Unspecified injury of face, initial encounter: Secondary | ICD-10-CM

## 2014-10-23 DIAGNOSIS — R569 Unspecified convulsions: Secondary | ICD-10-CM

## 2014-10-23 DIAGNOSIS — G40909 Epilepsy, unspecified, not intractable, without status epilepticus: Secondary | ICD-10-CM | POA: Insufficient documentation

## 2014-10-23 DIAGNOSIS — Z720 Tobacco use: Secondary | ICD-10-CM | POA: Diagnosis not present

## 2014-10-23 DIAGNOSIS — S098XXA Other specified injuries of head, initial encounter: Secondary | ICD-10-CM | POA: Diagnosis not present

## 2014-10-23 DIAGNOSIS — Z791 Long term (current) use of non-steroidal anti-inflammatories (NSAID): Secondary | ICD-10-CM | POA: Insufficient documentation

## 2014-10-23 DIAGNOSIS — Y9289 Other specified places as the place of occurrence of the external cause: Secondary | ICD-10-CM | POA: Diagnosis not present

## 2014-10-23 DIAGNOSIS — F1210 Cannabis abuse, uncomplicated: Secondary | ICD-10-CM | POA: Diagnosis not present

## 2014-10-23 LAB — CBC WITH DIFFERENTIAL/PLATELET
BASOS PCT: 0 % (ref 0–1)
Basophils Absolute: 0 10*3/uL (ref 0.0–0.1)
EOS PCT: 3 % (ref 0–5)
Eosinophils Absolute: 0.2 10*3/uL (ref 0.0–0.7)
HCT: 45.3 % (ref 39.0–52.0)
Hemoglobin: 16 g/dL (ref 13.0–17.0)
Lymphocytes Relative: 27 % (ref 12–46)
Lymphs Abs: 1.9 10*3/uL (ref 0.7–4.0)
MCH: 32.3 pg (ref 26.0–34.0)
MCHC: 35.3 g/dL (ref 30.0–36.0)
MCV: 91.5 fL (ref 78.0–100.0)
Monocytes Absolute: 0.4 10*3/uL (ref 0.1–1.0)
Monocytes Relative: 6 % (ref 3–12)
Neutro Abs: 4.6 10*3/uL (ref 1.7–7.7)
Neutrophils Relative %: 64 % (ref 43–77)
PLATELETS: 146 10*3/uL — AB (ref 150–400)
RBC: 4.95 MIL/uL (ref 4.22–5.81)
RDW: 12 % (ref 11.5–15.5)
WBC: 7.1 10*3/uL (ref 4.0–10.5)

## 2014-10-23 LAB — URINALYSIS, ROUTINE W REFLEX MICROSCOPIC
Bilirubin Urine: NEGATIVE
Glucose, UA: NEGATIVE mg/dL
Hgb urine dipstick: NEGATIVE
Ketones, ur: NEGATIVE mg/dL
LEUKOCYTES UA: NEGATIVE
NITRITE: NEGATIVE
Protein, ur: NEGATIVE mg/dL
SPECIFIC GRAVITY, URINE: 1.011 (ref 1.005–1.030)
UROBILINOGEN UA: 0.2 mg/dL (ref 0.0–1.0)
pH: 6 (ref 5.0–8.0)

## 2014-10-23 LAB — COMPREHENSIVE METABOLIC PANEL
ALK PHOS: 80 U/L (ref 39–117)
ALT: 33 U/L (ref 0–53)
ANION GAP: 12 (ref 5–15)
AST: 40 U/L — ABNORMAL HIGH (ref 0–37)
Albumin: 3.6 g/dL (ref 3.5–5.2)
BILIRUBIN TOTAL: 0.5 mg/dL (ref 0.3–1.2)
BUN: 9 mg/dL (ref 6–23)
CHLORIDE: 102 mmol/L (ref 96–112)
CO2: 24 mmol/L (ref 19–32)
Calcium: 9.3 mg/dL (ref 8.4–10.5)
Creatinine, Ser: 1.18 mg/dL (ref 0.50–1.35)
GFR, EST AFRICAN AMERICAN: 82 mL/min — AB (ref >90)
GFR, EST NON AFRICAN AMERICAN: 70 mL/min — AB (ref >90)
GLUCOSE: 100 mg/dL — AB (ref 70–99)
POTASSIUM: 3.6 mmol/L (ref 3.5–5.1)
SODIUM: 138 mmol/L (ref 135–145)
Total Protein: 6.5 g/dL (ref 6.0–8.3)

## 2014-10-23 LAB — RAPID URINE DRUG SCREEN, HOSP PERFORMED
Amphetamines: NOT DETECTED
BARBITURATES: NOT DETECTED
Benzodiazepines: NOT DETECTED
Cocaine: NOT DETECTED
Opiates: NOT DETECTED
TETRAHYDROCANNABINOL: POSITIVE — AB

## 2014-10-23 LAB — CBG MONITORING, ED: GLUCOSE-CAPILLARY: 95 mg/dL (ref 70–99)

## 2014-10-23 MED ORDER — LEVETIRACETAM 500 MG PO TABS: 500.0000 mg | ORAL_TABLET | Freq: Two times a day (BID) | ORAL | Status: AC

## 2014-10-23 NOTE — ED Provider Notes (Signed)
CSN: [id number]     Arrival date & time 10/23/14  2044 History   First MD Initiated Contact with Patient 10/23/14 2046     Chief Complaint  Patient presents with  . Seizures     (Consider location/radiation/quality/duration/timing/severity/associated sxs/prior Treatment) Patient is a 49 y.o. male presenting with seizures. The history is provided by a relative. No language interpreter was used.  Seizures Mr. Kaucher is a 49 y.o white male with a history of left sided CVA and a history of seizures.  He presents today for a witnessed seizure that lasted 3-4 minutes after the patient fell onto the ground from a standing position, hitting his head and biting his tongue and shaking all over. He has residual right sided weakness and aphasia except to say, "it's a boy" since his CVA 3 years ago. He is able to nod his head to answer questions. It is unknown whether he had any preceding symptoms. He returned to baseline before paramedics arrived.   Past Medical History  Diagnosis Date  . Stroke   . Seizures   . 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 Back surgery     History reviewed. No pertinent family history. History  Substance Use Topics  . Smoking status: Current Every Day Smoker  . Smokeless tobacco: Not on file  . Alcohol Use: No    Review of Systems  Unable to perform ROS Neurological: Positive for seizures.  He is unable to speak except to say, "its a boy."    Allergies  Review of patient's allergies indicates no known allergies.  Home Medications   Prior to Admission medications   Medication Sig Start Date End Date Taking? Authorizing Provider  pantoprazole (PROTONIX) 40 MG tablet Take 40 mg by mouth daily. 09/23/14  Yes Historical Provider, MD  baclofen (LIORESAL) 20 MG tablet Take 1 tablet (20 mg total) by mouth 4 (four) times daily. 10/23/13   Rodolph Bong, MD  escitalopram  (LEXAPRO) 20 MG tablet Take 1 tablet (20 mg total) by mouth daily. 10/23/13   Rodolph Bong, MD  levETIRAcetam (KEPPRA) 500 MG tablet Take 1 tablet (500 mg total) by mouth 2 (two) times daily. 10/23/14   Mirian Mo, MD   BP 131/80 mmHg  Pulse 75  Resp 12  SpO2 98% Physical Exam  Constitutional: He is oriented to person, place, and time. He appears well-developed and well-nourished.  HENT:  Head: Head is without raccoon's eyes, without abrasion and without laceration.  Right Ear: Tympanic membrane, external ear and ear canal normal.  Left Ear: Tympanic membrane, external ear and ear canal normal.  Mouth/Throat: Uvula is midline and oropharynx is clear and moist.  Dentition in tact.  He has dried blood surrounding the upper and lower lips due to small left sided tongue laceration.   Eyes: Conjunctivae, EOM and lids are normal. Pupils are equal, round, and reactive to light.  Neck: Normal range of motion. Neck supple.  Cardiovascular: Normal rate, regular rhythm and normal heart sounds.   Pulmonary/Chest: Effort normal and breath sounds normal. No respiratory distress.  Abdominal: Soft. There is no tenderness.  Musculoskeletal: Normal range of motion.  Neurological: He is alert and oriented to person, place, and time.  He has weakness of right upper and lower extremities.   Skin: Skin is warm and dry.  Nursing note and vitals reviewed.   ED Course  Procedures (including  critical care time) Labs Review Labs Reviewed  CBC WITH DIFFERENTIAL/PLATELET - Abnormal; Notable for the following:    Platelets 146 (*)    All other components within normal limits  COMPREHENSIVE METABOLIC PANEL - Abnormal; Notable for the following:    Glucose, Bld 100 (*)    AST 40 (*)    GFR calc non Af Amer 70 (*)    GFR calc Af Amer 82 (*)    All other components within normal limits  URINALYSIS, ROUTINE W REFLEX MICROSCOPIC - Abnormal; Notable for the following:    APPearance CLOUDY (*)    All other  components within normal limits  URINE RAPID DRUG SCREEN (HOSP PERFORMED) - Abnormal; Notable for the following:    Tetrahydrocannabinol POSITIVE (*)    All other components within normal limits  ETHANOL  CBG MONITORING, ED  CBG MONITORING, ED    Imaging Review Ct Head Wo Contrast  10/23/2014   CLINICAL DATA:  Postictal, seizure 1 hour ago. Altered mental status. Right facial droop and right weakness.  EXAM: CT HEAD WITHOUT CONTRAST  CT CERVICAL SPINE WITHOUT CONTRAST  TECHNIQUE: Multidetector CT imaging of the head and cervical spine was performed following the standard protocol without intravenous contrast. Multiplanar CT image reconstructions of the cervical spine were also generated.  COMPARISON:  08/08/2014  FINDINGS: CT HEAD FINDINGS  Chronic encephalomalacia within the left cerebral hemisphere MCA territory with associated ex vacuo dilatation of the left lateral ventricle. No significant interval change. Exam is limited with motion artifact. Images were repeated. No acute intracranial hemorrhage, new mass lesion, definite new infarction, midline shift, herniation, hydrocephalus, or extra-axial fluid collection. Cerebellar atrophy as well. Orbits are symmetric. Wallerian degeneration of the left cerebral peduncle. Mastoids are clear. Minor ethmoid mucosal thickening. Other sinuses clear.  CT CERVICAL SPINE FINDINGS  Diffuse cervical degenerative changes and spondylosis at all levels but most severe at C4-5, C5-6, and C6-7. These levels demonstrate disc space narrowing, sclerosis and osteophytes. Preserved vertebral body heights. Facets aligned. No subluxation or dislocation. Intact odontoid. Degenerative changes of the C1-2 articulation. Normal prevertebral soft tissues. No soft tissue asymmetry in the neck. Clear lung apices.  IMPRESSION: No acute intracranial finding or acute cervical spine fracture. Stable exam.  Remote left MCA territory infarct with encephalomalacia  Chronic sinusitis  Cervical  spondylosis.   Electronically Signed   By: Judie PetitM.  Shick M.D.   On: 10/23/2014 21:59   Ct Cervical Spine Wo Contrast  10/23/2014   CLINICAL DATA:  Postictal, seizure 1 hour ago. Altered mental status. Right facial droop and right weakness.  EXAM: CT HEAD WITHOUT CONTRAST  CT CERVICAL SPINE WITHOUT CONTRAST  TECHNIQUE: Multidetector CT imaging of the head and cervical spine was performed following the standard protocol without intravenous contrast. Multiplanar CT image reconstructions of the cervical spine were also generated.  COMPARISON:  08/08/2014  FINDINGS: CT HEAD FINDINGS  Chronic encephalomalacia within the left cerebral hemisphere MCA territory with associated ex vacuo dilatation of the left lateral ventricle. No significant interval change. Exam is limited with motion artifact. Images were repeated. No acute intracranial hemorrhage, new mass lesion, definite new infarction, midline shift, herniation, hydrocephalus, or extra-axial fluid collection. Cerebellar atrophy as well. Orbits are symmetric. Wallerian degeneration of the left cerebral peduncle. Mastoids are clear. Minor ethmoid mucosal thickening. Other sinuses clear.  CT CERVICAL SPINE FINDINGS  Diffuse cervical degenerative changes and spondylosis at all levels but most severe at C4-5, C5-6, and C6-7. These levels demonstrate disc space narrowing, sclerosis and  osteophytes. Preserved vertebral body heights. Facets aligned. No subluxation or dislocation. Intact odontoid. Degenerative changes of the C1-2 articulation. Normal prevertebral soft tissues. No soft tissue asymmetry in the neck. Clear lung apices.  IMPRESSION: No acute intracranial finding or acute cervical spine fracture. Stable exam.  Remote left MCA territory infarct with encephalomalacia  Chronic sinusitis  Cervical spondylosis.   Electronically Signed   By: Judie Petit.  Shick M.D.   On: 10/23/2014 21:59     EKG Interpretation None     ED ECG REPORT Date/Time: Tuesday October 24 2014   Ventricular Rate: 79 PR Interval: 167 QRS Duration: 88  QT Interval: 375  QTC Calculation: 430   Text Interpretation: Sinus rhythm. Otherwise  normal ECG No old tracing to compare Confirmed by Dr. Mirian Mo on 10/24/2014    MDM   Final diagnoses:  Seizure  Tongue injury, initial encounter   Patient has a history of seizures and presents for seizure, tonic/clonic type, that lasted 3-4 minutes and witnessed by 80 year old son just prior to arrival. He currently takes Keppra for seizures and took his last pill today. His pcp and neurologist is in [place].  He just recently moved to [place] to live with his ex-wife and has not changed physicians due to [facility]. Vitals are stable and patient is back to baseline according to daughter.  CT head shows no intracranial hemorrhage, no new mass lesion, no infarct, or hydrocephalus, and no fluid collection.  CT c-spine shows no fracture. This seizure unlikely due to [facility] or meningitis.  His last seizure was in January.  Patient states he has been taking his Keppra and took his last pill today. Daughter states that she does not know if he takes the pills daily. He is requesting a refill and says he ran out today. He does nod his head when asked if he had been drinking this week but denies drinking today. He is positive for Thc but otherwise his labs are unremarkable. I gave him [facility] for f/u and a refill for Keppra.  Daughter agrees with the plan for local neurology f/u.  Patient is not to drive or operate heavy machinery until seen by neurologist.     Catha Gosselin, PA-C 10/24/14 1610  Mirian Mo, MD 10/25/14 [phone]

## 2014-10-23 NOTE — ED Notes (Signed)
Per daughter, pt is at baseline. "Easy boy" is the only phrase he says when asked questions.

## 2014-10-23 NOTE — Discharge Instructions (Signed)

## 2014-10-23 NOTE — ED Notes (Signed)
Per EMS, pt had seizure 1 hour ago - son witnessed pt have tonic-clonic seizure for about 3-4 minutes. Pt bit tongue, bleeding controlled. Pt unaware x 4, when asked questions, he repeats "easy boy."  Pt has hx of seizures and stroke. Pt takes seizure medication, but son is unaware what medication.  R facial droop and R sd weakness noted on arrival - daughter unsure if that is his baseline.

## 2015-03-11 ENCOUNTER — Emergency Department (HOSPITAL_COMMUNITY): Payer: Medicaid Other

## 2015-03-11 ENCOUNTER — Encounter (HOSPITAL_COMMUNITY): Payer: Self-pay | Admitting: Emergency Medicine

## 2015-03-11 ENCOUNTER — Emergency Department (HOSPITAL_COMMUNITY)
Admission: EM | Admit: 2015-03-11 | Discharge: 2015-03-11 | Disposition: A | Discharge status: Home / Self Care | Payer: Medicaid Other | Attending: Emergency Medicine | Admitting: Emergency Medicine

## 2015-03-11 DIAGNOSIS — G40909 Epilepsy, unspecified, not intractable, without status epilepticus: Secondary | ICD-10-CM | POA: Insufficient documentation

## 2015-03-11 DIAGNOSIS — Z720 Tobacco use: Secondary | ICD-10-CM | POA: Insufficient documentation

## 2015-03-11 DIAGNOSIS — R4182 Altered mental status, unspecified: Secondary | ICD-10-CM | POA: Insufficient documentation

## 2015-03-11 DIAGNOSIS — Z8673 Personal history of transient ischemic attack (TIA), and cerebral infarction without residual deficits: Secondary | ICD-10-CM | POA: Insufficient documentation

## 2015-03-11 DIAGNOSIS — T887XXA Unspecified adverse effect of drug or medicament, initial encounter: Secondary | ICD-10-CM | POA: Insufficient documentation

## 2015-03-11 DIAGNOSIS — T50905A Adverse effect of unspecified drugs, medicaments and biological substances, initial encounter: Secondary | ICD-10-CM

## 2015-03-11 LAB — DIFFERENTIAL
BASOS ABS: 0.1 10*3/uL (ref 0.0–0.1)
BASOS PCT: 1 % (ref 0–1)
EOS ABS: 0.6 10*3/uL (ref 0.0–0.7)
EOS PCT: 7 % — AB (ref 0–5)
LYMPHS ABS: 3 10*3/uL (ref 0.7–4.0)
Lymphocytes Relative: 37 % (ref 12–46)
Monocytes Absolute: 0.6 10*3/uL (ref 0.1–1.0)
Monocytes Relative: 7 % (ref 3–12)
NEUTROS ABS: 4.1 10*3/uL (ref 1.7–7.7)
NEUTROS PCT: 48 % (ref 43–77)

## 2015-03-11 LAB — URINALYSIS, ROUTINE W REFLEX MICROSCOPIC
BILIRUBIN URINE: NEGATIVE
GLUCOSE, UA: NEGATIVE mg/dL
HGB URINE DIPSTICK: NEGATIVE
Ketones, ur: NEGATIVE mg/dL
LEUKOCYTES UA: NEGATIVE
Nitrite: NEGATIVE
PH: 6 (ref 5.0–8.0)
Protein, ur: NEGATIVE mg/dL
SPECIFIC GRAVITY, URINE: 1.012 (ref 1.005–1.030)
UROBILINOGEN UA: 0.2 mg/dL (ref 0.0–1.0)

## 2015-03-11 LAB — I-STAT CHEM 8, ED
BUN: 13 mg/dL (ref 6–20)
CREATININE: 1.2 mg/dL (ref 0.61–1.24)
Calcium, Ion: 1.21 mmol/L (ref 1.12–1.23)
Chloride: 105 mmol/L (ref 101–111)
GLUCOSE: 96 mg/dL (ref 65–99)
HEMATOCRIT: 53 % — AB (ref 39.0–52.0)
HEMOGLOBIN: 18 g/dL — AB (ref 13.0–17.0)
POTASSIUM: 4.2 mmol/L (ref 3.5–5.1)
Sodium: 143 mmol/L (ref 135–145)
TCO2: 27 mmol/L (ref 0–100)

## 2015-03-11 LAB — COMPREHENSIVE METABOLIC PANEL
ALBUMIN: 4.2 g/dL (ref 3.5–5.0)
ALT: 37 U/L (ref 17–63)
ANION GAP: 7 (ref 5–15)
AST: 39 U/L (ref 15–41)
Alkaline Phosphatase: 84 U/L (ref 38–126)
BUN: 10 mg/dL (ref 6–20)
CHLORIDE: 106 mmol/L (ref 101–111)
CO2: 28 mmol/L (ref 22–32)
CREATININE: 1.17 mg/dL (ref 0.61–1.24)
Calcium: 9.4 mg/dL (ref 8.9–10.3)
GFR calc Af Amer: >60 mL/min (ref >60)
Glucose, Bld: 104 mg/dL — ABNORMAL HIGH (ref 65–99)
Potassium: 4.4 mmol/L (ref 3.5–5.1)
SODIUM: 141 mmol/L (ref 135–145)
Total Bilirubin: 0.7 mg/dL (ref 0.3–1.2)
Total Protein: 7.5 g/dL (ref 6.5–8.1)

## 2015-03-11 LAB — ETHANOL

## 2015-03-11 LAB — CBC
HEMATOCRIT: 50.3 % (ref 39.0–52.0)
HEMOGLOBIN: 17.3 g/dL — AB (ref 13.0–17.0)
MCH: 32.3 pg (ref 26.0–34.0)
MCHC: 34.4 g/dL (ref 30.0–36.0)
MCV: 93.8 fL (ref 78.0–100.0)
PLATELETS: 158 10*3/uL (ref 150–400)
RBC: 5.36 MIL/uL (ref 4.22–5.81)
RDW: 12.1 % (ref 11.5–15.5)
WBC: 8.3 10*3/uL (ref 4.0–10.5)

## 2015-03-11 LAB — I-STAT TROPONIN, ED: Troponin i, poc: 0 ng/mL (ref 0.00–0.08)

## 2015-03-11 LAB — PROTIME-INR
INR: 1.06 (ref 0.00–1.49)
PROTHROMBIN TIME: 14 s (ref 11.6–15.2)

## 2015-03-11 LAB — RAPID URINE DRUG SCREEN, HOSP PERFORMED
Amphetamines: NOT DETECTED
Barbiturates: NOT DETECTED
Benzodiazepines: POSITIVE — AB
Cocaine: NOT DETECTED
OPIATES: NOT DETECTED
TETRAHYDROCANNABINOL: POSITIVE — AB

## 2015-03-11 LAB — APTT: APTT: 29 s (ref 24–37)

## 2015-03-11 LAB — CBG MONITORING, ED: GLUCOSE-CAPILLARY: 105 mg/dL — AB (ref 65–99)

## 2015-03-11 MED ORDER — SODIUM CHLORIDE 0.9 % IV BOLUS (SEPSIS)
1000.0000 mL | Freq: Once | INTRAVENOUS | Status: AC
  Administered 2015-03-11: 1000 mL via INTRAVENOUS

## 2015-03-11 NOTE — ED Provider Notes (Signed)
CSN: [id number]     Arrival date & time 03/11/15  4098 History   First MD Initiated Contact with Patient 03/11/15 [phone]     Chief Complaint  Patient presents with  . Altered Mental Status   Level V Caveat: Altered mental status.   Jim Fields is a 49 y.o. 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 They report the patient had a CVA 4 years ago due to drug use and normally has right sided defcitits and only says the words "easy boy" and shanks his head yes and no. Family reports that since yesterday around 1300 patient has been slurring more what little words that he says. They report that he normally is able to walk to the bathroom on his own dragging his right foot, but that last night he was unable to get to the bathroom on his own. They report he was stumbling. The daughter reports she feels is more drowsy and sleepy.  She reports he is to see a dentist tomorrow for dental pain he had been having. They do not believe he has any access to any drugs. Family members deny any fevers, vomiting, worsening cough, or that he has been complaining of any pain, or chest pain or abdominal pain.    (Consider location/radiation/quality/duration/timing/severity/associated sxs/prior Treatment) HPI  Past Medical History  Diagnosis Date  . Stroke   . Seizures   . 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 Back surgery     History reviewed. No pertinent family history. Social History  Substance Use Topics  . Smoking status: Current Every Day Smoker  . Smokeless tobacco: None  . Alcohol Use: No    Review of Systems  Unable to perform ROS: Mental status change      Allergies  Review of patient's allergies indicates no  known allergies.  Home Medications   Prior to Admission medications   Medication Sig Start Date End Date Taking? Authorizing Provider  levETIRAcetam (KEPPRA) 500 MG tablet Take 1 tablet (500 mg total) by mouth 2 (two) times daily. 10/23/14  Yes Mirian Mo, MD  baclofen (LIORESAL) 20 MG tablet Take 1 tablet (20 mg total) by mouth 4 (four) times daily. Patient not taking: Reported on 03/11/2015 10/23/13   Rodolph Bong, MD  escitalopram (LEXAPRO) 20 MG tablet Take 1 tablet (20 mg total) by mouth daily. Patient not taking: Reported on 03/11/2015 10/23/13   Rodolph Bong, MD   BP 116/79 mmHg  Pulse 81  Temp(Src) 97.8 F (36.6 C) (Oral)  Resp 30  Ht 6' (1.829 m)  Wt 175 lb (79.379 kg)  BMI 23.73 kg/m2  SpO2 97% Physical Exam  Constitutional: He appears well-developed and well-nourished. No distress.  Nontoxic appearing.  HENT:  Head: Normocephalic and atraumatic.  Right Ear: External ear normal.  Left Ear: External ear normal.  Mouth/Throat: Oropharynx is clear and moist.  No facial swelling. Tongue protrusion is normal. No posterior oropharyngeal erythema or edema.   Eyes: Conjunctivae are normal. Pupils are equal, round, and reactive to light. Right eye exhibits no discharge. Left eye exhibits no discharge.  Neck: Normal range of motion. Neck supple. No JVD present.  Cardiovascular: Normal rate, regular rhythm, normal heart sounds  and intact distal pulses.   Pulmonary/Chest: Effort normal and breath sounds normal. No respiratory distress. He has no wheezes. He has no rales.  Lungs are clear to auscultation bilaterally.  Abdominal: Soft. Bowel sounds are normal. He exhibits no distension. There is no tenderness. There is no guarding.  Abdomen is soft and nontender to palpation.  Musculoskeletal: He exhibits no edema or tenderness.  No lower extremity edema or tenderness. Patient is favoring the use of his left sided extremities.  Lymphadenopathy:    He has no cervical adenopathy.   Neurological: He is alert.  Patient is drowsy. He will awaken with verbal stimuli and follows most commands. He just repeats the words no. He appears to have some right-sided facial droop. Patient tracks appropriately and is able to spontaneously move all of his extremities. He has clear weakness in his right side. He responds no to every question I ask him.  Skin: Skin is warm and dry. No rash noted. He is not diaphoretic. No erythema. No pallor.  Nursing note and vitals reviewed.   ED Course  Procedures (including critical care time) Labs Review Labs Reviewed  CBC - Abnormal; Notable for the following:    Hemoglobin 17.3 (*)    All other components within normal limits  DIFFERENTIAL - Abnormal; Notable for the following:    Eosinophils Relative 7 (*)    All other components within normal limits  COMPREHENSIVE METABOLIC PANEL - Abnormal; Notable for the following:    Glucose, Bld 104 (*)    All other components within normal limits  URINE RAPID DRUG SCREEN, HOSP PERFORMED - Abnormal; Notable for the following:    Benzodiazepines POSITIVE (*)    Tetrahydrocannabinol POSITIVE (*)    All other components within normal limits  CBG MONITORING, ED - Abnormal; Notable for the following:    Glucose-Capillary 105 (*)    All other components within normal limits  I-STAT CHEM 8, ED - Abnormal; Notable for the following:    Hemoglobin 18.0 (*)    HCT 53.0 (*)    All other components within normal limits  PROTIME-INR  APTT  URINALYSIS, ROUTINE W REFLEX MICROSCOPIC (NOT AT [facility])  ETHANOL  I-STAT TROPOININ, ED    Imaging Review Ct Head Wo Contrast  03/11/2015   CLINICAL DATA:  Acute onset of altered mental status. Initial encounter.  EXAM: CT HEAD WITHOUT CONTRAST  TECHNIQUE: Contiguous axial images were obtained from the base of the skull through the vertex without intravenous contrast.  COMPARISON:  CT of the head performed 10/23/2014  FINDINGS: There is no evidence of acute infarction,  mass lesion, or intra- or extra-axial hemorrhage on CT.  Chronic infarct is noted at the left parietal and temporal lobes, with associated encephalomalacia. There is ex vacuo dilatation of the left lateral ventricle. Mild cerebellar atrophy is noted. A small chronic lacunar infarct is noted at the right anterior corona radiata.  The brainstem and fourth ventricle are within normal limits. The basal ganglia are unremarkable in appearance. The cerebral hemispheres demonstrate grossly normal gray-white differentiation. No mass effect or midline shift is seen.  There is no evidence of fracture; visualized osseous structures are unremarkable in appearance. The orbits are within normal limits. There is opacification of the ethmoid air cells, and mild mucosal thickening at the frontal sinuses. The remaining paranasal sinuses and mastoid air cells are well-aerated. No significant soft tissue abnormalities are seen.  IMPRESSION: 1. No acute intracranial pathology seen on CT. 2. Chronic infarct at the left  parietal and temporal lobes, with associated encephalomalacia. Ex vacuo dilatation of the left lateral ventricle. 3. Small chronic lacunar infarct at the right anterior corona radiata. 4. Mild mucosal thickening at the frontal sinuses.   Electronically Signed   By: Roanna Raider M.D.   On: 03/11/2015 06:53   I, Lawana Chambers, personally reviewed and evaluated these images and lab results as part of my medical decision-making.   EKG Interpretation None      Filed Vitals:   03/11/15 0954 03/11/15 0955 03/11/15 0956 03/11/15 0957  BP:      Pulse: 70 69 70 81  Temp:      TempSrc:      Resp: 24 28 24 30   Height:      Weight:      SpO2: 97% 97% 97% 97%     MDM   Meds given in ED:  Medications  sodium chloride 0.9 % bolus 1,000 mL (0 mLs Intravenous Stopped 03/11/15 0936)    Discharge Medication List as of 03/11/2015  9:59 AM      Final diagnoses:  Altered mental status, unspecified altered  mental status type  Medication side effect, initial encounter    This is a 49 y.o. 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 They report the patient had a CVA 4 years ago due to drug use and normally has right sided defcitits and only says the words "easy boy" and shanks his head yes and no. Family reports that since yesterday around 1300 patient has been slurring more what little words that he says. They report that he normally is able to walk to the bathroom on his own dragging his right foot, but that last night he was unable to get to the bathroom on his own. They report he was stumbling. The daughter reports she feels is more drowsy and sleepy.  On exam the patient is afebrile nontoxic appearing. The patient appears drowsy but arouses to verbal stimuli. The patient is nontachypneic or tachycardic. Patient is able to spontaneously move all of his extremities however he has obvious right-sided deficits. He is favoring his left side. Patient's CBC and CMP are unremarkable. CT of his head without contrast shows no acute intracranial pathology seen on CT. His urine drug screen is positive for benzos and THC. I question if benzos are the cause of his AMS today. He is not prescribed benzodiazepines in the family does not give him any. He does have a history of drug abuse. Throughout the ED course the patient became more alert. Prior to discharge the patient is up and alert and repeating "easy boy" like he does at his baseline. The daughter at bedside reports that he appears to be back at baseline now. I suspect this was due to him taking benzos. Family reports they will watch out for this. The patient elicits that he wants to go home. Family at bedside are comfortable with discharge. I advised the patient to follow-up with their  primary care provider this week. I advised the patient to return to the emergency department with new or worsening symptoms or new concerns. The patient verbalized understanding and agreement with plan.    This patient was discussed with and evaluated by Dr. Denton Lank who agrees with assessment and plan.   Everlene Farrier, PA-C 03/11/15 1048  Cathren Laine, MD 03/15/15 [phone]

## 2015-03-11 NOTE — ED Notes (Signed)
Ex wife at bedside reports that no one has come in to tell them anything and that its been 5 hours. Daughter at bedside, who was already in there when this RN came in this morning, was updated on results by off going nurse Marta Antu. PA Dansie notified of ex wife's concerns, will come see pt and family.

## 2015-03-11 NOTE — ED Notes (Signed)
Patient transported to CT scan . 

## 2015-03-11 NOTE — ED Notes (Signed)
Dr Milon Dikes came into room ant talked with family.

## 2015-03-11 NOTE — ED Notes (Signed)
CBG 105 

## 2015-03-11 NOTE — Discharge Instructions (Signed)
We suspect his alerted mental status was due to taking benzodiazepines. Please keep the patient away from any benzodiazepines. Please encourage him to stop using marijuana.   Altered Mental Status Altered mental status most often refers to an abnormal change in your responsiveness and awareness. It can affect your speech, thought, mobility, memory, attention span, or alertness. It can range from slight confusion to complete unresponsiveness (coma). Altered mental status can be a sign of a serious underlying medical condition. Rapid evaluation and medical treatment is necessary for patients having an altered mental status. CAUSES   Low blood sugar (hypoglycemia) or diabetes.  Severe loss of body fluids (dehydration) or a body salt (electrolyte) imbalance.  A stroke or other neurologic problem, such as dementia or delirium.  A head injury or tumor.  A drug or alcohol overdose.  Exposure to toxins or poisons.  Depression, anxiety, and stress.  A low oxygen level (hypoxia).  An infection.  Blood loss.  Twitching or shaking (seizure).  Heart problems, such as heart attack or heart rhythm problems (arrhythmias).  A body temperature that is too low or too high (hypothermia or hyperthermia). DIAGNOSIS  A diagnosis is based on your history, symptoms, physical and neurologic examinations, and diagnostic tests. Diagnostic tests may include:  Measurement of your blood pressure, pulse, breathing, and oxygen levels (vital signs).  Blood tests.  Urine tests.  X-ray exams.  A computerized magnetic scan (magnetic resonance imaging, MRI).  A computerized X-ray scan (computed tomography, CT scan). TREATMENT  Treatment will depend on the cause. Treatment may include:  Management of an underlying medical or mental health condition.  Critical care or support in the hospital. HOME CARE INSTRUCTIONS   Only take over-the-counter or prescription medicines for pain, discomfort, or fever as  directed by your caregiver.  Manage underlying conditions as directed by your caregiver.  Eat a healthy, well-balanced diet to maintain strength.  Join a support group or prevention program to cope with the condition or trauma that caused the altered mental status. Ask your caregiver to help choose a program that works for you.  Follow up with your caregiver for further examination, therapy, or testing as directed. SEEK MEDICAL CARE IF:   You feel unwell or have chills.  You or your family notice a change in your behavior or your alertness.  You have trouble following your caregiver's treatment plan.  You have questions or concerns. SEEK IMMEDIATE MEDICAL CARE IF:   You have a rapid heartbeat or have chest pain.  You have difficulty breathing.  You have a fever.  You have a headache with a stiff neck.  You cough up blood.  You have blood in your urine or stool.  You have severe agitation or confusion. MAKE SURE YOU:   Understand these instructions.  Will watch your condition.  Will get help right away if you are not doing well or get worse. Document Released: 01/01/2010 Document Revised: 10/06/2011 Document Reviewed: 01/01/2010 [facility] Patient Information 2015 [place], [place]. This information is not intended to replace advice given to you by your health care provider. Make sure you discuss any questions you have with your health care provider.

## 2015-03-11 NOTE — ED Notes (Signed)
Patient here with change in mental status. Previous history of stroke with right sided paralysis and aphasia. Patient normally only says "easy boy". Family last saw patient on Friday @ 1600, and discovered patient was altered Saturday @ 1300. Family reports that patient is more altered and is slurring his words more than usual.

## 2016-09-05 ENCOUNTER — Encounter (HOSPITAL_COMMUNITY): Payer: Self-pay

## 2016-09-05 ENCOUNTER — Emergency Department (HOSPITAL_COMMUNITY)
Admission: EM | Admit: 2016-09-05 | Discharge: 2016-09-05 | Disposition: A | Discharge status: Home / Self Care | Payer: Medicaid Other | Attending: Emergency Medicine | Admitting: Emergency Medicine

## 2016-09-05 DIAGNOSIS — F17200 Nicotine dependence, unspecified, uncomplicated: Secondary | ICD-10-CM | POA: Diagnosis not present

## 2016-09-05 DIAGNOSIS — G40909 Epilepsy, unspecified, not intractable, without status epilepticus: Secondary | ICD-10-CM | POA: Diagnosis present

## 2016-09-05 DIAGNOSIS — Z8673 Personal history of transient ischemic attack (TIA), and cerebral infarction without residual deficits: Secondary | ICD-10-CM | POA: Diagnosis not present

## 2016-09-05 DIAGNOSIS — R569 Unspecified convulsions: Secondary | ICD-10-CM

## 2016-09-05 MED ORDER — SODIUM CHLORIDE 0.9 % IV SOLN
1000.0000 mg | Freq: Once | INTRAVENOUS | Status: AC
  Administered 2016-09-05: 1000 mg via INTRAVENOUS
  Filled 2016-09-05: qty 10

## 2016-09-05 NOTE — ED Notes (Signed)
Patient famiily state understanding of DC info. All belongings with family.

## 2016-09-05 NOTE — ED Notes (Signed)
Patient is ready to go home.assisted to [facility].  Keppra started. patient does not want anymore blood test. Patient removed BP cuff and EKG leads, stressing he wants to leave. MD notified.

## 2016-09-05 NOTE — ED Triage Notes (Signed)
Here after a witnessed seizure by his son. Pt has a hy of seizures and is non compliant with his kepra. Hy of stroke with L sided and speech deficits. Did not hit head but doesn't remember seizure

## 2016-09-05 NOTE — ED Provider Notes (Signed)
[facility]-EMERGENCY DEPT Provider Note   CSN: [id number] Arrival date & time: 09/05/16  1420  By signing my name below, I, Jim Fields, attest that this documentation has been prepared under the direction and in the presence of Jim Razor, Fields. Electronically Signed: Rosario Fields, ED Scribe. 09/05/16. 3:22 PM.  History   Chief Complaint Chief Complaint  Patient presents with  . Seizures   The history is provided by the patient, medical records and a relative. No language interpreter was used.    HPI Comments: Jim Fields is a 51 y.o. male with a h/o prior CVA w/ residual right-sided deficits, seizures, and epilepsy, who presents to the Emergency Department s/p witnessed seizure-like activity which occurred prior to arrival. Per family, pt has had a h/o seizures since his prior CVA and additionally has had residual right-sided deficits and is unable to communicate well effectively. During his seizure they note that he did seem to bite his lower lip and there was minimal bleeding which is now controlled. No known head injury otherwise. Additionally, his relative notes that he has experienced general malaise and overall was feeling unwell for the past several days prior to his seizure today. He has been recently non-compliant with his medications which pt confirms is true over the past several weeks. Relatives reports that he is currently acting back at his baseline and he otherwise has no complaints. He denies fever, or any other associated symptoms.   Past Medical History:  Diagnosis Date  . 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 Seizures (HCC)   . Stroke [facility])    Patient Active Problem List   Diagnosis Date Noted  . Muscle spasms of head and/or neck 10/23/2013  . History of stroke 10/23/2013  . Aphasia 10/23/2013  . Depression 10/23/2013  . Epilepsy (HCC) 10/23/2013   Past Surgical History:  Procedure Laterality Date    . BACK SURGERY      Home Medications    Prior to Admission medications   Medication Sig Start Date End Date Taking? Authorizing Provider  baclofen (LIORESAL) 20 MG tablet Take 1 tablet (20 mg total) by mouth 4 (four) times daily. Patient not taking: Reported on 03/11/2015 10/23/13   Jim Fields  escitalopram (LEXAPRO) 20 MG tablet Take 1 tablet (20 mg total) by mouth daily. Patient not taking: Reported on 03/11/2015 10/23/13   Jim Fields  levETIRAcetam (KEPPRA) 500 MG tablet Take 1 tablet (500 mg total) by mouth 2 (two) times daily. 10/23/14   Jim Fields   Family History No family history on file.  Social History Social History  Substance Use Topics  . Smoking status: Current Every Day Smoker  . Smokeless tobacco: Never Used  . Alcohol use Yes   Allergies   Patient has no known allergies.  Review of Systems Review of Systems  Constitutional: Negative for fever.  Neurological: Positive for seizures and weakness (right-sided, baseline).  All other systems reviewed and are negative.  Physical Exam Updated Vital Signs BP 132/88 (BP Location: Left Arm)   Pulse 97   Temp 98.4 F (36.9 C) (Oral)   Resp 18   SpO2 91%   Physical Exam  Constitutional: He appears well-developed and well-nourished.  HENT:  Head: Normocephalic.  Right Ear: External ear normal.  Left Ear: External ear normal.  Nose: Nose normal.  Small abrasion is noted to the inner lower lip.   Eyes: Conjunctivae are normal.  Right eye exhibits no discharge. Left eye exhibits no discharge.  Neck: Normal range of motion.  Cardiovascular: Normal rate, regular rhythm and normal heart sounds.   No murmur heard. Pulmonary/Chest: Effort normal and breath sounds normal. No respiratory distress. He has no wheezes. He has no rales.  Abdominal: Soft. There is no tenderness. There is no rebound and no guarding.  Musculoskeletal: Normal range of motion. He exhibits no edema or tenderness.  Neurological:  He is alert. No cranial nerve deficit. Coordination normal.  Dysarthric, but seems to understand all questions and nods yes and no appropriately. Strength on left side is normal. 3/5 strength to RUE. Proximal 5/5 strength to RLE. Right foot is contracted.   Skin: Skin is warm and dry. No rash noted. No erythema. No pallor.  Psychiatric: He has a normal mood and affect. His behavior is normal.  Nursing note and vitals reviewed.  ED Treatments / Results  DIAGNOSTIC STUDIES: Oxygen Saturation is 98% on RA, normal by my interpretation.   COORDINATION OF CARE: 3:21 PM-Discussed next steps with pt and family. Pt and family verbalized understanding and is agreeable with the plan.   Labs (all labs ordered are listed, but only abnormal results are displayed) Labs Reviewed - No data to display  EKG  EKG Interpretation  Date/Time:  Friday September 05 2016 14:40:30 EST Ventricular Rate:  95 PR Interval:    QRS Duration: 85 QT Interval:  353 QTC Calculation: 444 R Axis:   73 Text Interpretation:  Sinus rhythm Baseline wander in lead(s) V1 Confirmed by Juleen ChinaKOHUT  Fields, Briley Sulton ([id number]) on 09/05/2016 4:41:34 PM      Radiology No results found.  Procedures Procedures   Medications Ordered in ED Medications - No data to display  Initial Impression / Assessment and Plan / ED Course  I have reviewed the triage vital signs and the nursing notes.  Pertinent labs & imaging results that were available during my care of the patient were reviewed by me and considered in my medical decision making (see chart for details).  51 year old male with seizure. He has a history the same. He has baseline deficits from prior stroke. He is at his baseline per patient and his family at bedside. Patient is declining any testing in the emergency room. He was agreeable to getting a dose of Keppra since he has been noncompliant recently. Discharged in NAD. Offered prescription for keppra, but he states he has sufficient  quantity but simply wasn't it taking it as recommended.   Final Clinical Impressions(s) / ED Diagnoses   Final diagnoses:  Seizure [facility](HCC)   New Prescriptions New Prescriptions   No medications on file   I personally preformed the services scribed in my presence. The recorded information has been reviewed is accurate. Jim RazorStephen Khalik Pewitt, Fields.     Jim RazorStephen Lameshia Hypolite, Fields 09/17/16 [phone]

## 2018-06-16 ENCOUNTER — Other Ambulatory Visit: Payer: Self-pay | Admitting: Nurse Practitioner

## 2018-06-16 DIAGNOSIS — B182 Chronic viral hepatitis C: Secondary | ICD-10-CM

## 2018-06-30 ENCOUNTER — Other Ambulatory Visit: Payer: Self-pay

## 2018-07-02 ENCOUNTER — Ambulatory Visit
Admission: RE | Admit: 2018-07-02 | Discharge: 2018-07-02 | Disposition: A | Discharge status: Home / Self Care | Payer: Medicaid Other | Source: Ambulatory Visit | Attending: Nurse Practitioner | Admitting: Nurse Practitioner

## 2018-07-02 DIAGNOSIS — B182 Chronic viral hepatitis C: Secondary | ICD-10-CM

## 2019-04-22 ENCOUNTER — Other Ambulatory Visit: Payer: Self-pay | Admitting: Internal Medicine

## 2019-04-22 DIAGNOSIS — Z20822 Contact with and (suspected) exposure to covid-19: Secondary | ICD-10-CM

## 2019-04-23 LAB — NOVEL CORONAVIRUS, NAA: SARS-CoV-2, NAA: NOT DETECTED

## 2019-12-15 IMAGING — US US ABDOMEN COMPLETE W/ ELASTOGRAPHY
1 series · 12 of 12 positions shown · non-contrast
Comparison: None.

CLINICAL DATA: Chronic hepatitis-C.



[Series 1: us abdomen complete w/ elastography · 0.19mm/px · 12 of 12 slices shown]
[im 1/12]
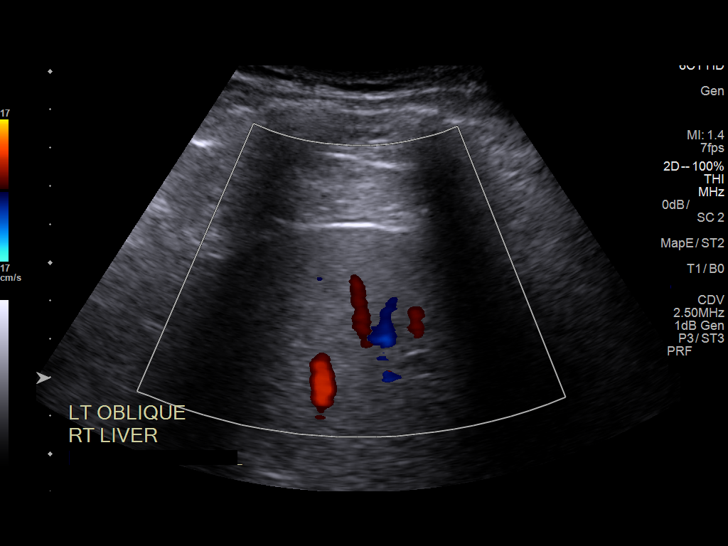
[im 2/12]
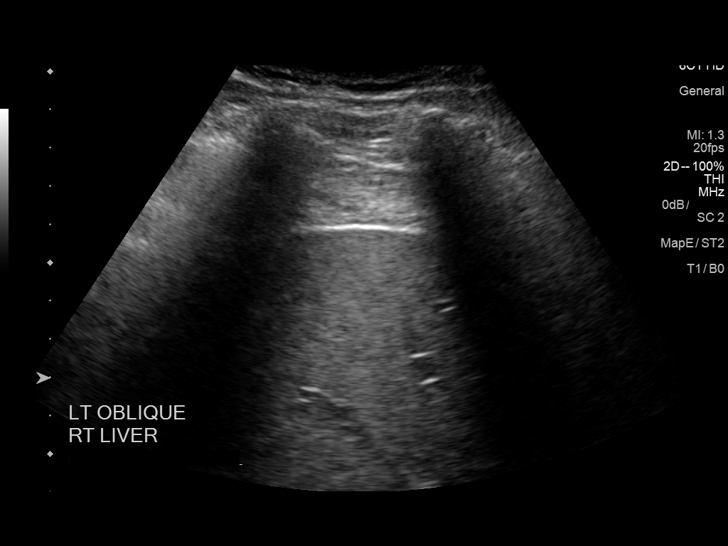
[im 3/12]
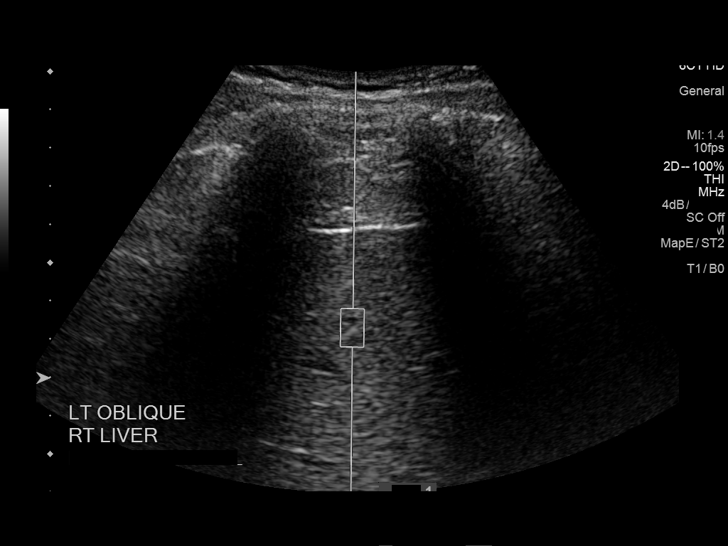
[im 4/12]
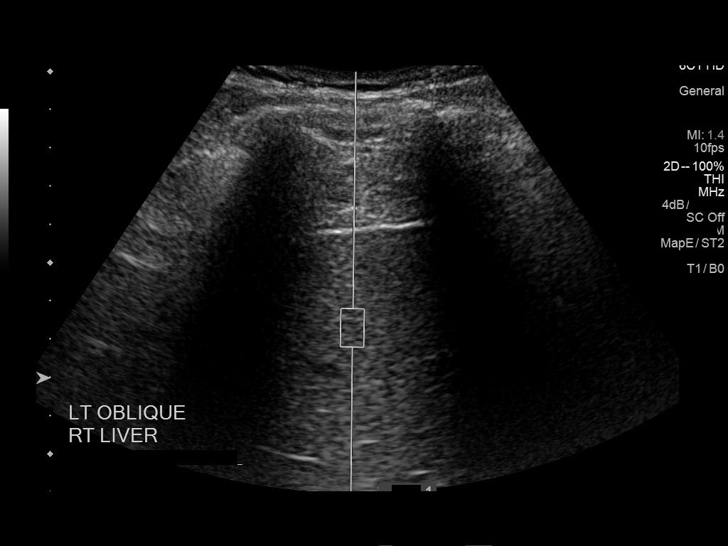
[im 5/12]
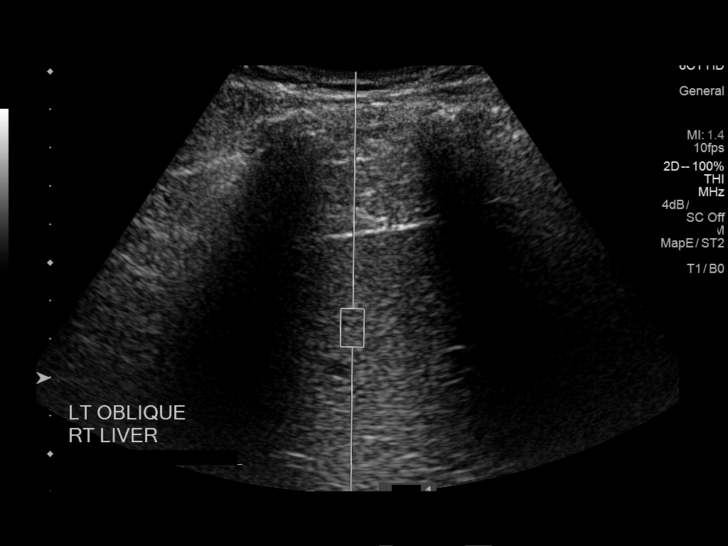
[im 6/12]
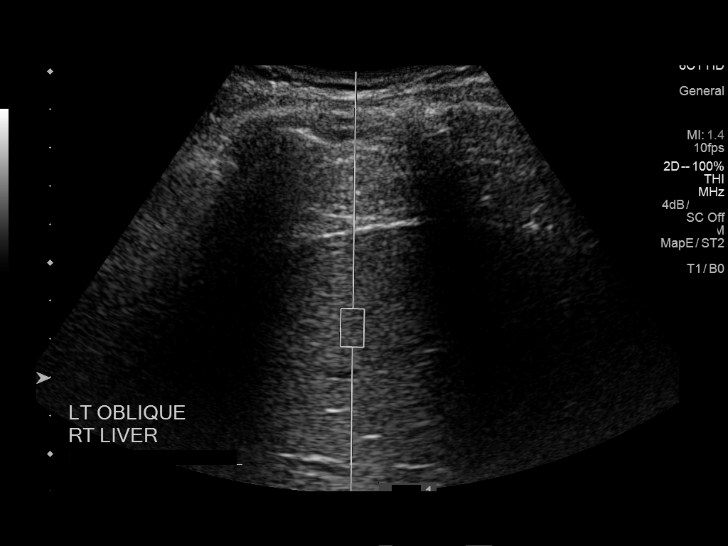
[im 7/12]
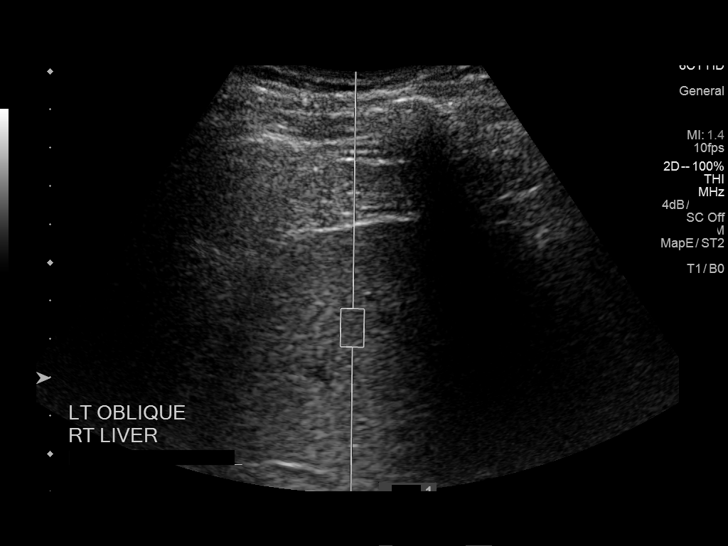
[im 8/12]
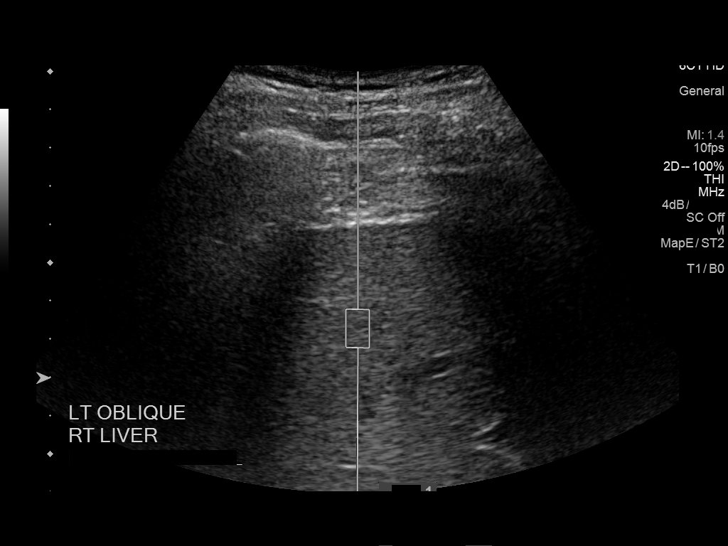
[im 9/12]
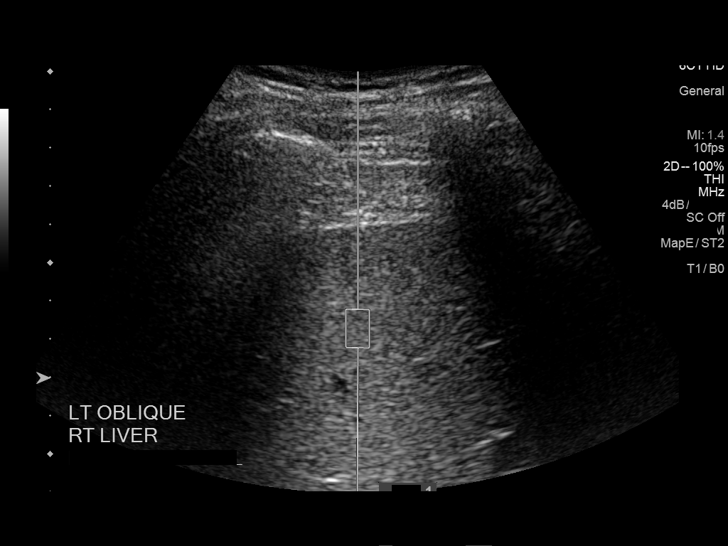
[im 10/12]
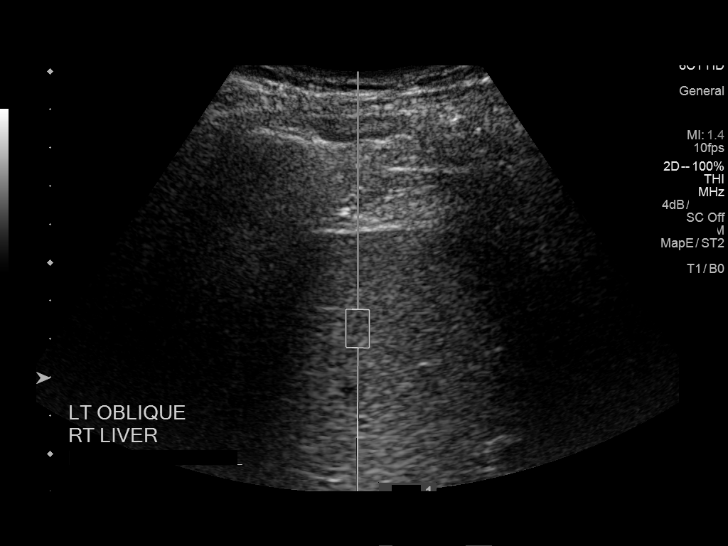
[im 11/12]
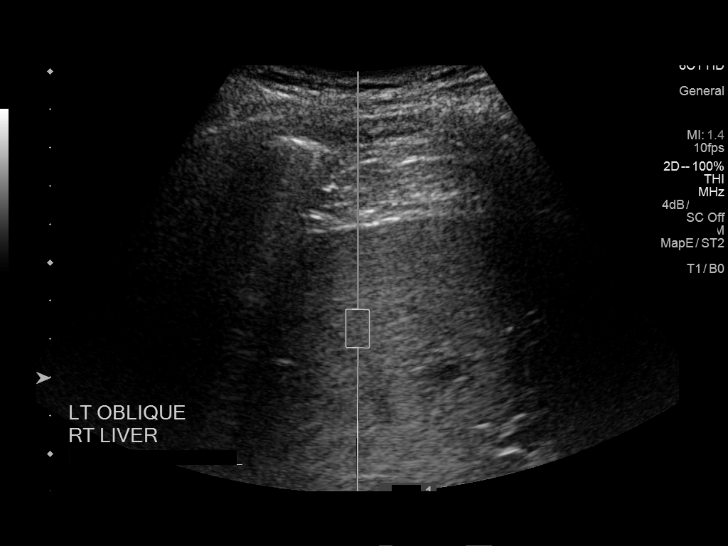
[im 12/12]
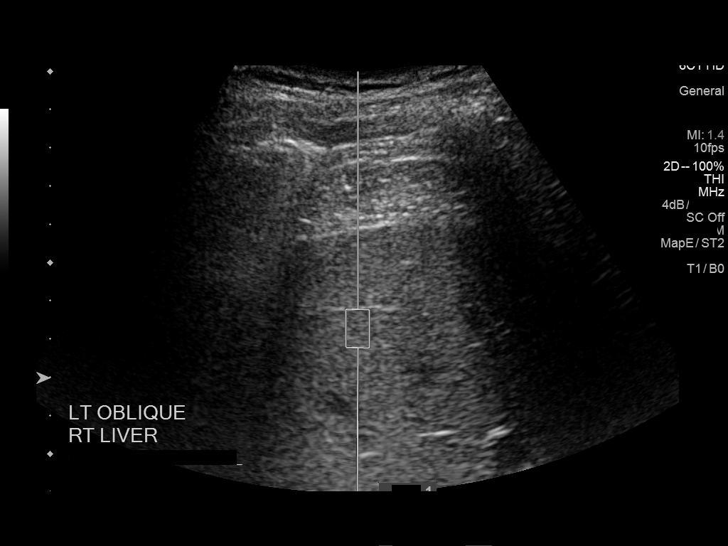

[12 of 12 positions shown; findings below may reference images not displayed]

FINDINGS: ULTRASOUND ABDOMEN

Gallbladder: No gallstones or wall thickening visualized. No
sonographic Murphy sign noted by sonographer.

Common bile duct: Diameter: 3 mm, within normal limits.

Liver: No focal lesion identified. Within normal limits in
parenchymal echogenicity. Portal vein is patent on color Doppler
imaging with normal direction of blood flow towards the liver.

IVC: No abnormality visualized.

Pancreas: Visualized portion unremarkable.

Spleen: Size and appearance within normal limits.

Right Kidney: Length: 11.0 cm. Echogenicity within normal limits. No
mass or hydronephrosis visualized.

Left Kidney: Length: 11.7 cm. Echogenicity within normal limits. No
mass or hydronephrosis visualized.

Abdominal aorta: No aneurysm visualized.

Other findings: None.

ULTRASOUND HEPATIC ELASTOGRAPHY

Device: Siemens Helix VTQ

Patient position: Oblique

Transducer 6C1

Number of measurements: 10

Hepatic segment:  8

Median velocity:   1.05 m/sec

IQR:

IQR/Median velocity ratio:

Corresponding Metavir fibrosis score:  F0/F1

Risk of fibrosis: Minimal

Limitations of exam: Patient inability to perform breath holding
during exam

Please note that abnormal shear wave velocities may also be
identified in clinical settings other than with hepatic fibrosis,
such as: acute hepatitis, elevated right heart and central venous
pressures including use of beta blockers, Shalley disease
(Sham Dayal), infiltrative processes such as
mastocytosis/amyloidosis/infiltrative tumor, extrahepatic
cholestasis, in the post-prandial state, and liver transplantation.
Correlation with patient history, laboratory data, and clinical
condition recommended.
IMPRESSION: ULTRASOUND ABDOMEN:

Negative. No hepatobiliary or other significant abnormality
identified.

ULTRASOUND HEPATIC ELASTOGRAPHY:

Median hepatic shear wave velocity is calculated at 1.05 m/sec.

Corresponding Metavir fibrosis score is F0/F1.

Risk of fibrosis is Minimal.

Follow-up: None required

## 2022-04-04 ENCOUNTER — Other Ambulatory Visit: Payer: Self-pay | Admitting: Physician Assistant

## 2022-04-04 DIAGNOSIS — Z87891 Personal history of nicotine dependence: Secondary | ICD-10-CM

## 2022-05-07 ENCOUNTER — Inpatient Hospital Stay: Admission: RE | Admit: 2022-05-07 | Payer: Medicaid Other | Source: Ambulatory Visit

## 2022-06-02 ENCOUNTER — Encounter: Payer: Self-pay | Admitting: Physician Assistant

## 2022-06-03 ENCOUNTER — Encounter: Payer: Self-pay | Admitting: Physician Assistant

## 2022-06-03 ENCOUNTER — Ambulatory Visit
Admission: RE | Admit: 2022-06-03 | Discharge: 2022-06-03 | Disposition: A | Discharge status: Home / Self Care | Payer: Medicaid Other | Source: Ambulatory Visit | Attending: Physician Assistant | Admitting: Physician Assistant

## 2022-06-03 DIAGNOSIS — Z87891 Personal history of nicotine dependence: Secondary | ICD-10-CM

## 2023-05-25 ENCOUNTER — Other Ambulatory Visit: Payer: Self-pay | Admitting: Physician Assistant

## 2023-05-25 DIAGNOSIS — Z87891 Personal history of nicotine dependence: Secondary | ICD-10-CM

## 2024-01-11 ENCOUNTER — Ambulatory Visit
Admission: RE | Admit: 2024-01-11 | Discharge: 2024-01-11 | Disposition: A | Discharge status: Home / Self Care | Source: Ambulatory Visit | Attending: Physician Assistant | Admitting: Physician Assistant

## 2024-01-11 DIAGNOSIS — Z87891 Personal history of nicotine dependence: Secondary | ICD-10-CM
